# Patient Record
Sex: Male | Born: 1955 | Race: White | Hispanic: No | Marital: Married | Smoking: Never smoker
Health system: Southern US, Community
[De-identification: ages and names within clinical notes are randomized; demographics above are authoritative.]

## PROBLEM LIST (undated history)

## (undated) DIAGNOSIS — M2550 Pain in unspecified joint: Secondary | ICD-10-CM

## (undated) DIAGNOSIS — M1990 Unspecified osteoarthritis, unspecified site: Secondary | ICD-10-CM

## (undated) DIAGNOSIS — G709 Myoneural disorder, unspecified: Secondary | ICD-10-CM

## (undated) DIAGNOSIS — I10 Essential (primary) hypertension: Secondary | ICD-10-CM

## (undated) DIAGNOSIS — I2510 Atherosclerotic heart disease of native coronary artery without angina pectoris: Secondary | ICD-10-CM

## (undated) DIAGNOSIS — E119 Type 2 diabetes mellitus without complications: Secondary | ICD-10-CM

## (undated) DIAGNOSIS — K219 Gastro-esophageal reflux disease without esophagitis: Secondary | ICD-10-CM

## (undated) DIAGNOSIS — R3911 Hesitancy of micturition: Secondary | ICD-10-CM

## (undated) HISTORY — PX: CORONARY ANGIOPLASTY: SHX604

---

## 2013-07-15 HISTORY — PX: COLONOSCOPY: SHX174

## 2015-05-04 ENCOUNTER — Other Ambulatory Visit: Payer: Self-pay | Admitting: Neurosurgery

## 2015-07-01 ENCOUNTER — Encounter (HOSPITAL_COMMUNITY)
Admission: RE | Admit: 2015-07-01 | Discharge: 2015-07-01 | Disposition: A | Discharge status: Home / Self Care | Payer: Self-pay | Source: Ambulatory Visit | Attending: Neurosurgery | Admitting: Neurosurgery

## 2015-07-01 ENCOUNTER — Encounter (HOSPITAL_COMMUNITY): Payer: Self-pay

## 2015-07-01 DIAGNOSIS — Z79899 Other long term (current) drug therapy: Secondary | ICD-10-CM | POA: Insufficient documentation

## 2015-07-01 DIAGNOSIS — I10 Essential (primary) hypertension: Secondary | ICD-10-CM | POA: Insufficient documentation

## 2015-07-01 DIAGNOSIS — E119 Type 2 diabetes mellitus without complications: Secondary | ICD-10-CM | POA: Insufficient documentation

## 2015-07-01 DIAGNOSIS — M4806 Spinal stenosis, lumbar region: Secondary | ICD-10-CM | POA: Insufficient documentation

## 2015-07-01 DIAGNOSIS — Z955 Presence of coronary angioplasty implant and graft: Secondary | ICD-10-CM | POA: Insufficient documentation

## 2015-07-01 DIAGNOSIS — Z794 Long term (current) use of insulin: Secondary | ICD-10-CM | POA: Insufficient documentation

## 2015-07-01 DIAGNOSIS — Z7902 Long term (current) use of antithrombotics/antiplatelets: Secondary | ICD-10-CM | POA: Insufficient documentation

## 2015-07-01 DIAGNOSIS — Z7984 Long term (current) use of oral hypoglycemic drugs: Secondary | ICD-10-CM | POA: Insufficient documentation

## 2015-07-01 DIAGNOSIS — Z01812 Encounter for preprocedural laboratory examination: Secondary | ICD-10-CM | POA: Insufficient documentation

## 2015-07-01 DIAGNOSIS — Z0183 Encounter for blood typing: Secondary | ICD-10-CM | POA: Insufficient documentation

## 2015-07-01 DIAGNOSIS — K219 Gastro-esophageal reflux disease without esophagitis: Secondary | ICD-10-CM | POA: Insufficient documentation

## 2015-07-01 DIAGNOSIS — I2510 Atherosclerotic heart disease of native coronary artery without angina pectoris: Secondary | ICD-10-CM | POA: Insufficient documentation

## 2015-07-01 DIAGNOSIS — Z01818 Encounter for other preprocedural examination: Secondary | ICD-10-CM | POA: Insufficient documentation

## 2015-07-01 HISTORY — DX: Unspecified osteoarthritis, unspecified site: M19.90

## 2015-07-01 HISTORY — DX: Hesitancy of micturition: R39.11

## 2015-07-01 HISTORY — DX: Atherosclerotic heart disease of native coronary artery without angina pectoris: I25.10

## 2015-07-01 HISTORY — DX: Essential (primary) hypertension: I10

## 2015-07-01 HISTORY — DX: Gastro-esophageal reflux disease without esophagitis: K21.9

## 2015-07-01 HISTORY — DX: Myoneural disorder, unspecified: G70.9

## 2015-07-01 HISTORY — DX: Pain in unspecified joint: M25.50

## 2015-07-01 HISTORY — DX: Type 2 diabetes mellitus without complications: E11.9

## 2015-07-01 LAB — CBC
HCT: 46.8 % (ref 39.0–52.0)
HEMOGLOBIN: 16.1 g/dL (ref 13.0–17.0)
MCH: 30.8 pg (ref 26.0–34.0)
MCHC: 34.4 g/dL (ref 30.0–36.0)
MCV: 89.7 fL (ref 78.0–100.0)
PLATELETS: 255 10*3/uL (ref 150–400)
RBC: 5.22 MIL/uL (ref 4.22–5.81)
RDW: 12.8 % (ref 11.5–15.5)
WBC: 7 10*3/uL (ref 4.0–10.5)

## 2015-07-01 LAB — TYPE AND SCREEN
ABO/RH(D): A POS
Antibody Screen: NEGATIVE

## 2015-07-01 LAB — BASIC METABOLIC PANEL
ANION GAP: 9 (ref 5–15)
BUN: 13 mg/dL (ref 6–20)
CHLORIDE: 103 mmol/L (ref 101–111)
CO2: 24 mmol/L (ref 22–32)
Calcium: 9.6 mg/dL (ref 8.9–10.3)
Creatinine, Ser: 0.68 mg/dL (ref 0.61–1.24)
GFR calc non Af Amer: >60 mL/min (ref >60)
Glucose, Bld: 157 mg/dL — ABNORMAL HIGH (ref 65–99)
Potassium: 4.5 mmol/L (ref 3.5–5.1)
Sodium: 136 mmol/L (ref 135–145)

## 2015-07-01 LAB — ABO/RH: ABO/RH(D): A POS

## 2015-07-01 LAB — GLUCOSE, CAPILLARY: Glucose-Capillary: 208 mg/dL — ABNORMAL HIGH (ref 65–99)

## 2015-07-01 LAB — SURGICAL PCR SCREEN
MRSA, PCR: NEGATIVE
Staphylococcus aureus: NEGATIVE

## 2015-07-01 NOTE — Pre-Procedure Instructions (Signed)
Joneric Myren  07/01/2015      [facility] [address], [place] - [address] [place] [address] Phone: [phone] Fax: [phone]    Your procedure is scheduled on 07-13-2015   Wednesday   Report to [facility] Admitting at 6:30 AM .  Call this number if you have problems the morning of surgery:  [phone]   Remember:  Do not eat food or drink liquids after midnight.   Take these medicines the morning of surgery with A SIP OF WATER Amlodipine,metoprolol                                How to Manage Your Diabetes Before Surgery   Why is it important to control my blood sugar before and after surgery?   Improving blood sugar levels before and after surgery helps healing and can limit problems.  A way of improving blood sugar control is eating a healthy diet by:  - Eating less sugar and carbohydrates  - Increasing activity/exercise  - Talk with your doctor about reaching your blood sugar goals  High blood sugars (greater than 180 mg/dL) can raise your risk of infections and slow down your recovery so you will need to focus on controlling your diabetes during the weeks before surgery.  Make sure that the doctor who takes care of your diabetes knows about your planned surgery including the date and location.  How do I manage my blood sugars before surgery?   Check your blood sugar at least 4 times a day, 2 days before surgery to make sure that they are not too high or low.   Check your blood sugar the morning of your surgery when you wake up and every 2  hours until you get to the Short-Stay unit.  If your blood sugar is less than 70 mg/dL, you will need to treat for low blood sugar by:  Treat a low blood sugar (less than 70 mg/dL) with 1/2 cup of clear juice (cranberry or apple), 4 glucose tablets, OR glucose gel.  Recheck blood sugar in 15 minutes after treatment (to make sure it is greater than 70 mg/dL).   If blood sugar is not greater than 70 mg/dL on re-check, call [phone] for further instructions.   Report your blood sugar to the Short-Stay nurse when you get to Short-Stay.  References:  [facility], 2007 "How to Manage your Diabetes Before and After Surgery".  What do I do about my diabetes medications?   Do not take oral diabetes medicines (pills) the morning of surgery.    THE NIGHT BEFORE SURGERY, take regular dose before meal time, no insulin at bedtime    THE MORNING OF SURGERY, take 10-12  units of Novolin Insulin.    Do not take other diabetes injectables the day of surgery including Byetta, Victoza, Bydureon, and Trulicity.            Do not wear jewelry, make-up or nail polish.  Do not wear lotions, powders, or perfumes.    Do not shave 48 hours prior to surgery.  Men may shave face and neck.   Do not bring valuables to the hospital.  [facility] is not responsible for any belongings or valuables.  Contacts, dentures or bridgework may not be worn into surgery.  Leave your suitcase in the car.  After surgery it may  be brought to your room.  For patients admitted to the hospital, discharge time will be determined by your treatment team.   .    Special instructions:  SEE ATTACHED SHEET "PREPARING FOR SURGERY" FOR INSTRUCTIONS ON CHG SHOWER  Please read over the following fact sheets that you were given. Pain Booklet, Blood Transfusion Information and Surgical Site Infection Prevention

## 2015-07-01 NOTE — Progress Notes (Signed)
Requested from Dr. Henrietta Hooveravenkar EKG, Stress test ,OV, Cardiac clearance.  Requested records from [facility] on cardiac stent placement and angioplasty

## 2015-07-02 LAB — HEMOGLOBIN A1C
Hgb A1c MFr Bld: 8.1 % — ABNORMAL HIGH (ref 4.8–5.6)
Mean Plasma Glucose: 186 mg/dL

## 2015-07-04 NOTE — Progress Notes (Signed)
Anesthesia Chart Review:  Pt is 59 year old male scheduled for L4-5, L5-S1 PLIF, L4 laminectomy, L5 Gill procedure on 07/13/2015 with Dr. Newell CoralNudelman.   Pt see providers at [facility] in [place], last office visit 03/31/15.   PMH includes: CAD (s/p 4 DESs 2012), HTN, DM, GERD. Never smoker. BMI 30.   Medications include: amlodipine, ASA, lipitor, benazepril, plavix, dapagliflozin, gemfibrozil, glipizide, novolog, humulin N, metformin, metoprolol.   Preoperative labs reviewed.  Glucose 157, hgbA1c 8.1.   EKG 03/31/15: sinus bradycardia (58 bpm).   Nuclear stress test 04/12/15:  - Cardiac images do not reveal any evidence of ischemia.  - EF >65%  Cardiac cath 09/08/12 ([facility]):  - Severe stenosis of OM3.  - Normal LV function - One vessel CAD - Successful PCI/PTCA of proximal 3rd obtuse marginal coronary artery -- By notes, 4 DES placed in 2012 found to be patent  If no changes, I anticipate pt can proceed with surgery as scheduled.   Rica Mastngela Kabbe, FNP-BC [facility]/Anesthesiology Phone: [phone] 07/04/2015 3:49 PM

## 2015-07-12 MED ORDER — CEFAZOLIN SODIUM-DEXTROSE 2-3 GM-% IV SOLR
2.0000 g | INTRAVENOUS | Status: AC
  Administered 2015-07-13 (×2): 2 g via INTRAVENOUS
  Filled 2015-07-12: qty 50

## 2015-07-13 ENCOUNTER — Inpatient Hospital Stay (HOSPITAL_COMMUNITY): Payer: Self-pay

## 2015-07-13 ENCOUNTER — Encounter (HOSPITAL_COMMUNITY)
Admission: RE | Disposition: A | Discharge status: Home / Self Care | Payer: PPO | Source: Ambulatory Visit | Attending: Neurosurgery

## 2015-07-13 ENCOUNTER — Inpatient Hospital Stay (HOSPITAL_COMMUNITY): Payer: Self-pay | Admitting: Emergency Medicine

## 2015-07-13 ENCOUNTER — Inpatient Hospital Stay (HOSPITAL_COMMUNITY)
Admission: RE | Admit: 2015-07-13 | Discharge: 2015-07-15 | DRG: 460 | Disposition: A | Discharge status: Home / Self Care | Payer: Self-pay | Source: Ambulatory Visit | Attending: Neurosurgery | Admitting: Neurosurgery

## 2015-07-13 ENCOUNTER — Inpatient Hospital Stay (HOSPITAL_COMMUNITY): Payer: Self-pay | Admitting: Anesthesiology

## 2015-07-13 ENCOUNTER — Encounter (HOSPITAL_COMMUNITY): Payer: Self-pay | Admitting: *Deleted

## 2015-07-13 DIAGNOSIS — M4726 Other spondylosis with radiculopathy, lumbar region: Secondary | ICD-10-CM | POA: Diagnosis present

## 2015-07-13 DIAGNOSIS — I10 Essential (primary) hypertension: Secondary | ICD-10-CM | POA: Diagnosis present

## 2015-07-13 DIAGNOSIS — Z7984 Long term (current) use of oral hypoglycemic drugs: Secondary | ICD-10-CM

## 2015-07-13 DIAGNOSIS — E119 Type 2 diabetes mellitus without complications: Secondary | ICD-10-CM | POA: Diagnosis present

## 2015-07-13 DIAGNOSIS — Z419 Encounter for procedure for purposes other than remedying health state, unspecified: Secondary | ICD-10-CM

## 2015-07-13 DIAGNOSIS — M1990 Unspecified osteoarthritis, unspecified site: Secondary | ICD-10-CM | POA: Diagnosis present

## 2015-07-13 DIAGNOSIS — Z79899 Other long term (current) drug therapy: Secondary | ICD-10-CM

## 2015-07-13 DIAGNOSIS — M4806 Spinal stenosis, lumbar region: Principal | ICD-10-CM | POA: Diagnosis present

## 2015-07-13 DIAGNOSIS — M5137 Other intervertebral disc degeneration, lumbosacral region: Secondary | ICD-10-CM | POA: Diagnosis present

## 2015-07-13 DIAGNOSIS — K219 Gastro-esophageal reflux disease without esophagitis: Secondary | ICD-10-CM | POA: Diagnosis present

## 2015-07-13 DIAGNOSIS — Z885 Allergy status to narcotic agent status: Secondary | ICD-10-CM

## 2015-07-13 DIAGNOSIS — Z7982 Long term (current) use of aspirin: Secondary | ICD-10-CM

## 2015-07-13 DIAGNOSIS — M4316 Spondylolisthesis, lumbar region: Secondary | ICD-10-CM | POA: Diagnosis present

## 2015-07-13 DIAGNOSIS — M48061 Spinal stenosis, lumbar region without neurogenic claudication: Secondary | ICD-10-CM | POA: Diagnosis present

## 2015-07-13 DIAGNOSIS — R3911 Hesitancy of micturition: Secondary | ICD-10-CM | POA: Diagnosis present

## 2015-07-13 DIAGNOSIS — Z955 Presence of coronary angioplasty implant and graft: Secondary | ICD-10-CM

## 2015-07-13 DIAGNOSIS — S32059A Unspecified fracture of fifth lumbar vertebra, initial encounter for closed fracture: Secondary | ICD-10-CM | POA: Diagnosis present

## 2015-07-13 DIAGNOSIS — I2510 Atherosclerotic heart disease of native coronary artery without angina pectoris: Secondary | ICD-10-CM | POA: Diagnosis present

## 2015-07-13 DIAGNOSIS — Z23 Encounter for immunization: Secondary | ICD-10-CM

## 2015-07-13 DIAGNOSIS — Z794 Long term (current) use of insulin: Secondary | ICD-10-CM

## 2015-07-13 DIAGNOSIS — Z7902 Long term (current) use of antithrombotics/antiplatelets: Secondary | ICD-10-CM

## 2015-07-13 DIAGNOSIS — M5126 Other intervertebral disc displacement, lumbar region: Secondary | ICD-10-CM | POA: Diagnosis present

## 2015-07-13 LAB — GLUCOSE, CAPILLARY
Glucose-Capillary: 143 mg/dL — ABNORMAL HIGH (ref 65–99)
Glucose-Capillary: 139 mg/dL — ABNORMAL HIGH (ref 65–99)
Glucose-Capillary: 176 mg/dL — ABNORMAL HIGH (ref 65–99)
Glucose-Capillary: 136 mg/dL — ABNORMAL HIGH (ref 65–99)

## 2015-07-13 SURGERY — POSTERIOR LUMBAR FUSION 2 LEVEL
Anesthesia: General | Site: Back

## 2015-07-13 MED ORDER — ATORVASTATIN CALCIUM 40 MG PO TABS
40.0000 mg | ORAL_TABLET | Freq: Every day | ORAL | Status: DC
  Administered 2015-07-14 – 2015-07-15 (×2): 40 mg via ORAL
  Filled 2015-07-13: qty 1
  Filled 2015-07-13: qty 2
  Filled 2015-07-13: qty 1
  Filled 2015-07-13: qty 2

## 2015-07-13 MED ORDER — AMLODIPINE BESYLATE 10 MG PO TABS
10.0000 mg | ORAL_TABLET | Freq: Every day | ORAL | Status: DC
  Administered 2015-07-14 – 2015-07-15 (×2): 10 mg via ORAL
  Filled 2015-07-13 (×2): qty 1

## 2015-07-13 MED ORDER — SODIUM CHLORIDE 0.9 % IJ SOLN
INTRAMUSCULAR | Status: AC
  Filled 2015-07-13: qty 10

## 2015-07-13 MED ORDER — HYDROCODONE-ACETAMINOPHEN 5-325 MG PO TABS: 1.0000 | ORAL_TABLET | ORAL | Status: DC | PRN

## 2015-07-13 MED ORDER — ROCURONIUM BROMIDE 100 MG/10ML IV SOLN
INTRAVENOUS | Status: DC | PRN
  Administered 2015-07-13: 50 mg via INTRAVENOUS
  Administered 2015-07-13: 20 mg via INTRAVENOUS
  Administered 2015-07-13 (×2): 10 mg via INTRAVENOUS
  Administered 2015-07-13: 20 mg via INTRAVENOUS

## 2015-07-13 MED ORDER — SUGAMMADEX SODIUM 200 MG/2ML IV SOLN
INTRAVENOUS | Status: DC | PRN
  Administered 2015-07-13: 200 mg via INTRAVENOUS

## 2015-07-13 MED ORDER — MORPHINE SULFATE (PF) 4 MG/ML IV SOLN: 4.0000 mg | INTRAVENOUS | Status: DC | PRN

## 2015-07-13 MED ORDER — ONDANSETRON HCL 4 MG/2ML IJ SOLN: 4.0000 mg | Freq: Four times a day (QID) | INTRAMUSCULAR | Status: DC | PRN

## 2015-07-13 MED ORDER — GLIPIZIDE 10 MG PO TABS
10.0000 mg | ORAL_TABLET | Freq: Two times a day (BID) | ORAL | Status: DC
  Administered 2015-07-13 – 2015-07-15 (×4): 10 mg via ORAL
  Filled 2015-07-13 (×5): qty 1

## 2015-07-13 MED ORDER — METFORMIN HCL 500 MG PO TABS: 500.0000 mg | ORAL_TABLET | Freq: Two times a day (BID) | ORAL | Status: DC | PRN

## 2015-07-13 MED ORDER — PROPOFOL 10 MG/ML IV BOLUS
INTRAVENOUS | Status: AC
  Filled 2015-07-13: qty 20

## 2015-07-13 MED ORDER — BENAZEPRIL HCL 40 MG PO TABS
40.0000 mg | ORAL_TABLET | Freq: Every day | ORAL | Status: DC
  Administered 2015-07-13 – 2015-07-15 (×3): 40 mg via ORAL
  Filled 2015-07-13 (×3): qty 1

## 2015-07-13 MED ORDER — ACETAMINOPHEN 650 MG RE SUPP: 650.0000 mg | RECTAL | Status: DC | PRN

## 2015-07-13 MED ORDER — HYDROMORPHONE HCL 1 MG/ML IJ SOLN: 1.0000 mg | INTRAMUSCULAR | Status: DC | PRN

## 2015-07-13 MED ORDER — PROMETHAZINE HCL 25 MG/ML IJ SOLN
6.2500 mg | INTRAMUSCULAR | Status: DC | PRN
  Administered 2015-07-13: 6.25 mg via INTRAVENOUS

## 2015-07-13 MED ORDER — ALUM & MAG HYDROXIDE-SIMETH 200-200-20 MG/5ML PO SUSP: 30.0000 mL | Freq: Four times a day (QID) | ORAL | Status: DC | PRN

## 2015-07-13 MED ORDER — FENTANYL CITRATE (PF) 250 MCG/5ML IJ SOLN
INTRAMUSCULAR | Status: AC
  Filled 2015-07-13: qty 5

## 2015-07-13 MED ORDER — SODIUM CHLORIDE 0.9 % IV SOLN: INTRAVENOUS | Status: DC

## 2015-07-13 MED ORDER — ROCURONIUM BROMIDE 50 MG/5ML IV SOLN
INTRAVENOUS | Status: AC
  Filled 2015-07-13: qty 1

## 2015-07-13 MED ORDER — ONDANSETRON HCL 4 MG/2ML IJ SOLN
INTRAMUSCULAR | Status: DC | PRN
  Administered 2015-07-13: 4 mg via INTRAVENOUS

## 2015-07-13 MED ORDER — KETOROLAC TROMETHAMINE 30 MG/ML IJ SOLN
30.0000 mg | Freq: Four times a day (QID) | INTRAMUSCULAR | Status: DC
  Administered 2015-07-13 – 2015-07-15 (×7): 30 mg via INTRAVENOUS
  Filled 2015-07-13 (×7): qty 1

## 2015-07-13 MED ORDER — BISACODYL 10 MG RE SUPP: 10.0000 mg | Freq: Every day | RECTAL | Status: DC | PRN

## 2015-07-13 MED ORDER — HYDROXYZINE HCL 50 MG/ML IM SOLN
50.0000 mg | INTRAMUSCULAR | Status: DC | PRN
  Filled 2015-07-13: qty 1

## 2015-07-13 MED ORDER — FENTANYL CITRATE (PF) 100 MCG/2ML IJ SOLN
INTRAMUSCULAR | Status: DC | PRN
  Administered 2015-07-13: 50 ug via INTRAVENOUS
  Administered 2015-07-13: 150 ug via INTRAVENOUS
  Administered 2015-07-13 (×4): 50 ug via INTRAVENOUS

## 2015-07-13 MED ORDER — MAGNESIUM HYDROXIDE 400 MG/5ML PO SUSP: 30.0000 mL | Freq: Every day | ORAL | Status: DC | PRN

## 2015-07-13 MED ORDER — THROMBIN 20000 UNITS EX SOLR
CUTANEOUS | Status: DC | PRN
  Administered 2015-07-13: 10:00:00 via TOPICAL

## 2015-07-13 MED ORDER — CYCLOBENZAPRINE HCL 10 MG PO TABS
ORAL_TABLET | ORAL | Status: AC
  Filled 2015-07-13: qty 1

## 2015-07-13 MED ORDER — SODIUM CHLORIDE 0.9 % IJ SOLN
3.0000 mL | Freq: Two times a day (BID) | INTRAMUSCULAR | Status: DC
  Administered 2015-07-13 – 2015-07-14 (×2): 3 mL via INTRAVENOUS

## 2015-07-13 MED ORDER — METOCLOPRAMIDE HCL 5 MG/ML IJ SOLN
INTRAMUSCULAR | Status: DC | PRN
  Administered 2015-07-13: 10 mg via INTRAVENOUS

## 2015-07-13 MED ORDER — LIDOCAINE HCL (CARDIAC) 20 MG/ML IV SOLN
INTRAVENOUS | Status: DC | PRN
  Administered 2015-07-13: 60 mg via INTRAVENOUS

## 2015-07-13 MED ORDER — KETOROLAC TROMETHAMINE 30 MG/ML IJ SOLN
INTRAMUSCULAR | Status: AC
  Filled 2015-07-13: qty 1

## 2015-07-13 MED ORDER — METOCLOPRAMIDE HCL 5 MG/ML IJ SOLN
INTRAMUSCULAR | Status: AC
  Filled 2015-07-13: qty 2

## 2015-07-13 MED ORDER — MENTHOL 3 MG MT LOZG: 1.0000 | LOZENGE | OROMUCOSAL | Status: DC | PRN

## 2015-07-13 MED ORDER — MIDAZOLAM HCL 5 MG/5ML IJ SOLN
INTRAMUSCULAR | Status: DC | PRN
  Administered 2015-07-13: 2 mg via INTRAVENOUS

## 2015-07-13 MED ORDER — KETOROLAC TROMETHAMINE 30 MG/ML IJ SOLN
30.0000 mg | Freq: Once | INTRAMUSCULAR | Status: AC
  Administered 2015-07-13: 30 mg via INTRAVENOUS

## 2015-07-13 MED ORDER — OXYCODONE-ACETAMINOPHEN 5-325 MG PO TABS
ORAL_TABLET | ORAL | Status: AC
  Filled 2015-07-13: qty 2

## 2015-07-13 MED ORDER — INSULIN NPH (HUMAN) (ISOPHANE) 100 UNIT/ML ~~LOC~~ SUSP
20.0000 [IU] | Freq: Every day | SUBCUTANEOUS | Status: DC
  Administered 2015-07-14: 25 [IU] via SUBCUTANEOUS
  Administered 2015-07-15: 15 [IU] via SUBCUTANEOUS
  Filled 2015-07-13: qty 10

## 2015-07-13 MED ORDER — SUGAMMADEX SODIUM 200 MG/2ML IV SOLN
INTRAVENOUS | Status: AC
  Filled 2015-07-13: qty 2

## 2015-07-13 MED ORDER — ARTIFICIAL TEARS OP OINT
TOPICAL_OINTMENT | OPHTHALMIC | Status: DC | PRN
  Administered 2015-07-13: 1 via OPHTHALMIC

## 2015-07-13 MED ORDER — FENTANYL CITRATE (PF) 100 MCG/2ML IJ SOLN: 25.0000 ug | INTRAMUSCULAR | Status: DC | PRN

## 2015-07-13 MED ORDER — LIDOCAINE-EPINEPHRINE 1 %-1:100000 IJ SOLN
INTRAMUSCULAR | Status: DC | PRN
  Administered 2015-07-13: 30 mL

## 2015-07-13 MED ORDER — CYCLOBENZAPRINE HCL 10 MG PO TABS
10.0000 mg | ORAL_TABLET | Freq: Three times a day (TID) | ORAL | Status: DC | PRN
  Administered 2015-07-13 – 2015-07-15 (×5): 10 mg via ORAL
  Filled 2015-07-13 (×4): qty 1

## 2015-07-13 MED ORDER — OXYCODONE-ACETAMINOPHEN 5-325 MG PO TABS
1.0000 | ORAL_TABLET | ORAL | Status: DC | PRN
  Administered 2015-07-13 – 2015-07-15 (×11): 2 via ORAL
  Filled 2015-07-13 (×10): qty 2

## 2015-07-13 MED ORDER — ONDANSETRON HCL 4 MG PO TABS: 4.0000 mg | ORAL_TABLET | Freq: Four times a day (QID) | ORAL | Status: DC | PRN

## 2015-07-13 MED ORDER — ONDANSETRON HCL 4 MG/2ML IJ SOLN
INTRAMUSCULAR | Status: AC
  Filled 2015-07-13: qty 2

## 2015-07-13 MED ORDER — MIDAZOLAM HCL 2 MG/2ML IJ SOLN
INTRAMUSCULAR | Status: AC
  Filled 2015-07-13: qty 4

## 2015-07-13 MED ORDER — PHENYLEPHRINE 40 MCG/ML (10ML) SYRINGE FOR IV PUSH (FOR BLOOD PRESSURE SUPPORT)
PREFILLED_SYRINGE | INTRAVENOUS | Status: AC
  Filled 2015-07-13: qty 10

## 2015-07-13 MED ORDER — SODIUM CHLORIDE 0.9 % IJ SOLN: 3.0000 mL | INTRAMUSCULAR | Status: DC | PRN

## 2015-07-13 MED ORDER — LACTATED RINGERS IV SOLN
INTRAVENOUS | Status: DC | PRN
  Administered 2015-07-13: 08:00:00 via INTRAVENOUS

## 2015-07-13 MED ORDER — GEMFIBROZIL 600 MG PO TABS
600.0000 mg | ORAL_TABLET | Freq: Two times a day (BID) | ORAL | Status: DC
  Administered 2015-07-13 – 2015-07-15 (×4): 600 mg via ORAL
  Filled 2015-07-13 (×7): qty 1

## 2015-07-13 MED ORDER — BUPIVACAINE HCL (PF) 0.5 % IJ SOLN
INTRAMUSCULAR | Status: DC | PRN
  Administered 2015-07-13: 30 mL

## 2015-07-13 MED ORDER — CEFAZOLIN SODIUM-DEXTROSE 2-3 GM-% IV SOLR
INTRAVENOUS | Status: AC
  Filled 2015-07-13: qty 50

## 2015-07-13 MED ORDER — EPHEDRINE SULFATE 50 MG/ML IJ SOLN
INTRAMUSCULAR | Status: AC
  Filled 2015-07-13: qty 1

## 2015-07-13 MED ORDER — SODIUM CHLORIDE 0.9 % IR SOLN
Status: DC | PRN
  Administered 2015-07-13: 10:00:00

## 2015-07-13 MED ORDER — TAMSULOSIN HCL 0.4 MG PO CAPS
0.4000 mg | ORAL_CAPSULE | Freq: Every day | ORAL | Status: DC
  Administered 2015-07-14 – 2015-07-15 (×2): 0.4 mg via ORAL
  Filled 2015-07-13 (×2): qty 1

## 2015-07-13 MED ORDER — HYDROMORPHONE HCL 2 MG PO TABS: 1.0000 mg | ORAL_TABLET | ORAL | Status: DC | PRN

## 2015-07-13 MED ORDER — 0.9 % SODIUM CHLORIDE (POUR BTL) OPTIME
TOPICAL | Status: DC | PRN
  Administered 2015-07-13 (×2): 1000 mL

## 2015-07-13 MED ORDER — SODIUM CHLORIDE 0.9 % IV SOLN
10.0000 mg | INTRAVENOUS | Status: DC | PRN
  Administered 2015-07-13: 40 ug/min via INTRAVENOUS
  Administered 2015-07-13: 10 ug/min via INTRAVENOUS

## 2015-07-13 MED ORDER — ACETAMINOPHEN 325 MG PO TABS: 650.0000 mg | ORAL_TABLET | ORAL | Status: DC | PRN

## 2015-07-13 MED ORDER — SUCCINYLCHOLINE CHLORIDE 20 MG/ML IJ SOLN
INTRAMUSCULAR | Status: AC
  Filled 2015-07-13: qty 1

## 2015-07-13 MED ORDER — METOPROLOL TARTRATE 25 MG PO TABS
25.0000 mg | ORAL_TABLET | Freq: Two times a day (BID) | ORAL | Status: DC
  Administered 2015-07-13 – 2015-07-15 (×4): 25 mg via ORAL
  Filled 2015-07-13 (×5): qty 1

## 2015-07-13 MED ORDER — DAPAGLIFLOZIN PROPANEDIOL 10 MG PO TABS: 10.0000 mg | ORAL_TABLET | Freq: Every day | ORAL | Status: DC

## 2015-07-13 MED ORDER — LACTATED RINGERS IV SOLN
INTRAVENOUS | Status: DC | PRN
  Administered 2015-07-13 (×3): via INTRAVENOUS

## 2015-07-13 MED ORDER — HYDROXYZINE HCL 25 MG PO TABS: 50.0000 mg | ORAL_TABLET | ORAL | Status: DC | PRN

## 2015-07-13 MED ORDER — PHENYLEPHRINE HCL 10 MG/ML IJ SOLN
INTRAMUSCULAR | Status: DC | PRN
  Administered 2015-07-13 (×5): 80 ug via INTRAVENOUS

## 2015-07-13 MED ORDER — ACETAMINOPHEN 10 MG/ML IV SOLN
INTRAVENOUS | Status: AC
  Administered 2015-07-13: 1000 mg via INTRAVENOUS
  Filled 2015-07-13: qty 100

## 2015-07-13 MED ORDER — PHENOL 1.4 % MT LIQD: 1.0000 | OROMUCOSAL | Status: DC | PRN

## 2015-07-13 MED ORDER — LIDOCAINE HCL (CARDIAC) 20 MG/ML IV SOLN
INTRAVENOUS | Status: AC
  Filled 2015-07-13: qty 5

## 2015-07-13 MED ORDER — PROPOFOL 10 MG/ML IV BOLUS
INTRAVENOUS | Status: DC | PRN
  Administered 2015-07-13: 170 mg via INTRAVENOUS

## 2015-07-13 MED ORDER — EPHEDRINE SULFATE 50 MG/ML IJ SOLN
INTRAMUSCULAR | Status: DC | PRN
  Administered 2015-07-13: 10 mg via INTRAVENOUS
  Administered 2015-07-13: 5 mg via INTRAVENOUS
  Administered 2015-07-13: 10 mg via INTRAVENOUS
  Administered 2015-07-13 (×2): 5 mg via INTRAVENOUS

## 2015-07-13 MED ORDER — INSULIN ASPART 100 UNIT/ML ~~LOC~~ SOLN
20.0000 [IU] | Freq: Three times a day (TID) | SUBCUTANEOUS | Status: DC
  Administered 2015-07-13 – 2015-07-14 (×2): 30 [IU] via SUBCUTANEOUS
  Administered 2015-07-14 (×2): 35 [IU] via SUBCUTANEOUS
  Administered 2015-07-15: 40 [IU] via SUBCUTANEOUS

## 2015-07-13 MED ORDER — PROMETHAZINE HCL 25 MG/ML IJ SOLN
INTRAMUSCULAR | Status: AC
  Filled 2015-07-13: qty 1

## 2015-07-13 SURGICAL SUPPLY — 77 items
BAG DECANTER FOR FLEXI CONT (MISCELLANEOUS) ×2 IMPLANT
BENZOIN TINCTURE PRP APPL 2/3 (GAUZE/BANDAGES/DRESSINGS) ×2 IMPLANT
BLADE CLIPPER SURG (BLADE) IMPLANT
BRUSH SCRUB EZ PLAIN DRY (MISCELLANEOUS) ×2 IMPLANT
BUR ACRON 5.0MM COATED (BURR) ×2 IMPLANT
BUR MATCHSTICK NEURO 3.0 LAGG (BURR) ×2 IMPLANT
CANISTER SUCT 3000ML PPV (MISCELLANEOUS) ×2 IMPLANT
CAP LCK SPNE (Orthopedic Implant) ×6 IMPLANT
CAP LOCK SPINE RADIUS (Orthopedic Implant) ×6 IMPLANT
CAP LOCKING (Orthopedic Implant) ×6 IMPLANT
CONT SPEC 4OZ CLIKSEAL STRL BL (MISCELLANEOUS) ×2 IMPLANT
COVER BACK TABLE 60X90IN (DRAPES) ×2 IMPLANT
DERMABOND ADVANCED (GAUZE/BANDAGES/DRESSINGS) ×1
DERMABOND ADVANCED .7 DNX12 (GAUZE/BANDAGES/DRESSINGS) ×1 IMPLANT
DRAPE C-ARM 42X72 X-RAY (DRAPES) ×4 IMPLANT
DRAPE LAPAROTOMY 100X72X124 (DRAPES) ×2 IMPLANT
DRAPE POUCH INSTRU U-SHP 10X18 (DRAPES) ×2 IMPLANT
DRAPE PROXIMA HALF (DRAPES) ×2 IMPLANT
DRSG EMULSION OIL 3X3 NADH (GAUZE/BANDAGES/DRESSINGS) IMPLANT
ELECT REM PT RETURN 9FT ADLT (ELECTROSURGICAL) ×2
ELECTRODE REM PT RTRN 9FT ADLT (ELECTROSURGICAL) ×1 IMPLANT
GAUZE SPONGE 4X4 12PLY STRL (GAUZE/BANDAGES/DRESSINGS) ×2 IMPLANT
GAUZE SPONGE 4X4 16PLY XRAY LF (GAUZE/BANDAGES/DRESSINGS) IMPLANT
GLOVE BIOGEL PI IND STRL 8 (GLOVE) ×2 IMPLANT
GLOVE BIOGEL PI INDICATOR 8 (GLOVE) ×2
GLOVE ECLIPSE 7.5 STRL STRAW (GLOVE) ×4 IMPLANT
GLOVE EXAM NITRILE LRG STRL (GLOVE) IMPLANT
GLOVE EXAM NITRILE MD LF STRL (GLOVE) IMPLANT
GLOVE EXAM NITRILE XL STR (GLOVE) IMPLANT
GLOVE EXAM NITRILE XS STR PU (GLOVE) IMPLANT
GOWN STRL REUS W/ TWL LRG LVL3 (GOWN DISPOSABLE) IMPLANT
GOWN STRL REUS W/ TWL XL LVL3 (GOWN DISPOSABLE) ×2 IMPLANT
GOWN STRL REUS W/TWL 2XL LVL3 (GOWN DISPOSABLE) IMPLANT
GOWN STRL REUS W/TWL LRG LVL3 (GOWN DISPOSABLE)
GOWN STRL REUS W/TWL XL LVL3 (GOWN DISPOSABLE) ×2
KIT BASIN OR (CUSTOM PROCEDURE TRAY) ×2 IMPLANT
KIT INFUSE MEDIUM (Orthopedic Implant) ×2 IMPLANT
KIT ROOM TURNOVER OR (KITS) ×2 IMPLANT
MILL MEDIUM DISP (BLADE) ×2 IMPLANT
NEEDLE 18GX1X1/2 (RX/OR ONLY) (NEEDLE) ×2 IMPLANT
NEEDLE ASP BONE MRW 8GX15 (NEEDLE) ×2 IMPLANT
NEEDLE BONE MARROW 8GAX6 (NEEDLE) IMPLANT
NEEDLE SPNL 18GX3.5 QUINCKE PK (NEEDLE) ×2 IMPLANT
NEEDLE SPNL 22GX3.5 QUINCKE BK (NEEDLE) ×2 IMPLANT
NS IRRIG 1000ML POUR BTL (IV SOLUTION) ×4 IMPLANT
PACK LAMINECTOMY NEURO (CUSTOM PROCEDURE TRAY) ×2 IMPLANT
PAD ARMBOARD 7.5X6 YLW CONV (MISCELLANEOUS) ×6 IMPLANT
PATTIES SURGICAL .5 X.5 (GAUZE/BANDAGES/DRESSINGS) IMPLANT
PATTIES SURGICAL .5 X1 (DISPOSABLE) ×2 IMPLANT
PATTIES SURGICAL 1X1 (DISPOSABLE) IMPLANT
PEEK PLIF AVS 8X25X4 DEGREE (Peek) ×4 IMPLANT
PEEK PLIF AVS 9X25X4 (Peek) ×4 IMPLANT
ROD 50MM (Rod) ×2 IMPLANT
ROD SPNL 50X5.5XNS TI RDS (Rod) ×2 IMPLANT
SCREW 5.75X30MM (Screw) ×4 IMPLANT
SCREW 5.75X45MM (Screw) ×8 IMPLANT
SPONGE LAP 4X18 X RAY DECT (DISPOSABLE) ×2 IMPLANT
SPONGE NEURO XRAY DETECT 1X3 (DISPOSABLE) ×2 IMPLANT
SPONGE SURGIFOAM ABS GEL 100 (HEMOSTASIS) ×2 IMPLANT
STAPLER SKIN PROX WIDE 3.9 (STAPLE) IMPLANT
STRIP BIOACTIVE VITOSS 25X100X (Neuro Prosthesis/Implant) ×2 IMPLANT
STRIP BIOACTIVE VITOSS 25X52X4 (Orthopedic Implant) ×2 IMPLANT
STRIP CLOSURE SKIN 1/2X4 (GAUZE/BANDAGES/DRESSINGS) ×2 IMPLANT
SUT PROLENE 6 0 BV (SUTURE) IMPLANT
SUT VIC AB 1 CT1 18XBRD ANBCTR (SUTURE) ×3 IMPLANT
SUT VIC AB 1 CT1 8-18 (SUTURE) ×3
SUT VIC AB 2-0 CP2 18 (SUTURE) ×4 IMPLANT
SYR 3ML LL SCALE MARK (SYRINGE) IMPLANT
SYR 5ML LL (SYRINGE) IMPLANT
SYR CONTROL 10ML LL (SYRINGE) ×2 IMPLANT
TAPE CLOTH SURG 4X10 WHT LF (GAUZE/BANDAGES/DRESSINGS) ×2 IMPLANT
TOWEL OR 17X24 6PK STRL BLUE (TOWEL DISPOSABLE) ×2 IMPLANT
TOWEL OR 17X26 10 PK STRL BLUE (TOWEL DISPOSABLE) ×2 IMPLANT
TRAP SPECIMEN MUCOUS 40CC (MISCELLANEOUS) ×2 IMPLANT
TRAY FOLEY CATH 16FRSI W/METER (SET/KITS/TRAYS/PACK) ×2 IMPLANT
TRAY FOLEY W/METER SILVER 14FR (SET/KITS/TRAYS/PACK) IMPLANT
WATER STERILE IRR 1000ML POUR (IV SOLUTION) ×2 IMPLANT

## 2015-07-13 NOTE — Progress Notes (Signed)
Filed Vitals:   07/13/15 1445 07/13/15 1515 07/13/15 1530 07/13/15 1540  BP: 114/65 110/63 105/64 105/64  Pulse: 66 66 65 65  Temp:    97.5 F (36.4 C)  TempSrc:      Resp: 20 20    SpO2: 97% 95% 95% 94%    Patient resting in bed, but has ambulated with a staff through the length of the hallway. He did use a rolling walker. His dressing is clean and dry. He has a history of urinary hesitancy and is on Flomax, therefore we'll leave the catheter in overnight, and plan on removing in the morning. Patient is primarily having incisional pain, he's been treated with Toradol and Percocet. He feels that the left lumbar radicular pain is probably better.  Plan: Continue to progress through postoperative recovery. Monitor voiding function once Foley DC'd tomorrow morning. Encouraged to ambulate in the halls.  Hewitt ShortsNUDELMAN,ROBERT W, MD 07/13/2015, 8:16 PM

## 2015-07-13 NOTE — Op Note (Signed)
07/13/2015  2:10 PM  PATIENT:  Jose Dalton  59 y.o. male  PRE-OPERATIVE DIAGNOSIS:  lumbar stenosis with neurogenic claudication, lumbar spondylolisthesis, lumbar disc herniation, lumbar spondylosis, lumbar degenerative disease, left lumbar radiculopathy  POST-OPERATIVE DIAGNOSIS: lumbar stenosis with neurogenic claudication, lumbar spondylolisthesis, lumbar disc herniation, lumbar spondylosis, lumbar degenerative disease, left lumbar radiculopathy  PROCEDURE:  Procedure(s):  Inferior L4 laminectomy, L5 Gill procedure, bilateral L4-5 and L5-S1 posterior lumbar interbody arthrodesis with AVS peek interbody implants, Vitoss BA with bone marrow aspirate, and infuse, bilateral L4-S1 posterior lateral arthrodesis with radius segmental posterior instrumentation, locally harvested morcellized autograft, Vitoss BA with bone marrow aspirate, and infuse   SURGEON:  Surgeon(s): Shirlean Kelly, MD Tia Alert, MD  ASSISTANTS: Marikay Alar, M.D.  ANESTHESIA:   general  EBL:  Total I/O In: 3190 [I.V.:3000; Blood:190] Out: 1170 [Urine:670; Blood:500]  BLOOD ADMINISTERED:190 CC CELLSAVER  COUNT: Correct per nursing staff  DICTATION: Patient was brought to the operating room placed under general endotracheal anesthesia. The patient was turned to prone position, the lumbar region was prepped with Betadine soap and solution and draped in a sterile fashion. The midline was infiltrated with local anesthesia with epinephrine. A localizing x-ray was taken and then a midline incision was made and carried down through the subcutaneous tissue, bipolar cautery and electrocautery were used to maintain hemostasis. Dissection was carried down to the lumbar fascia. The fascia was incised bilaterally and the paraspinal muscles were dissected with a spinous process and lamina in a subperiosteal fashion. Another x-ray was taken for localization and the L4, L5, and S1 levels were localized. Dissection was then carried  out laterally over the facet complexes and the transverse processes of L4 and L5 and the ala of S1 were exposed and decorticated. 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 The posterior elements of L5 were progressively mobilized, freed up from the ligament of flavum and pseudoarthrosis, and removed en bloc, and then cleaned of soft tissue, and morselized in a bone mill for subsequent use as autograft. We then performed an inferior L4 laminectomy using the high-speed drill and Kerrison punches. The thecal sac and exiting nerve roots were identified including the L4 nerve roots the L5 nerve roots and the S1 nerve roots bilaterally. There is significant bony pseudoarthrotic overgrowth, overlying the L5 nerve roots as they exited, particularly on the left side. This was carefully removed, decompressing the exiting L5 nerve roots bilaterally.  Once the decompression of the stenotic compression of the thecal sac and exiting nerve roots was completed we proceeded with the posterior lumbar interbody arthrodesis. The annulus at each level was incised bilaterally and the disc space entered. A thorough discectomy was performed using pituitary rongeurs and curettes. Particular attention was paid to the disc protrusion laterally within the neural foramina at the L5-S1 level, causing ventral nerve root compression. This was carefully removed to further decompress the exiting L5 nerve roots bilaterally.  Once the discectomy was completed we began to prepare the endplate surfaces, removing the cartilaginous endplates surface. We then measured the height of the intervertebral disc space. We selected 9 x 25 x 4 AVS peek interbody implants for the L4-5 level, and 8 x 25 x 4 AVS peek interbody implants for the L5-S1 level.  The C-arm fluoroscope was then draped and brought in the field and we identified the pedicle entry points bilaterally at  the L4, L5, and S1 levels. Each of the 6 pedicles was  probed, we aspirated bone marrow aspirate from the vertebral bodies, this was injected over a 10 cc and a 5 cc strip of Vitoss BA. Then each of the pedicles was examined with the ball probe, good bony surfaces were found and no bony cuts were found. Each of the pedicles was then tapped with a 4.25 mm tap initially, and then re-tapped with a 5.25 mm tap, each screw hole was again examined with the ball probe good threading was found and no bony cuts were found. We then placed 5.75 by 45 millimeter screws bilaterally at the L4 level, 5.75 by 45 millimeter screws bilaterally at the L5 level, and 5.75 by 30 millimeter screws bilaterally at the S1 level.  We then packed the AVS peek interbody implants with Vitoss BA with bone marrow aspirate and infuse, and then placed the first implant at the L5-S1 level on the right side, carefully retracting the thecal sac and nerve root medially. We then went back to the left side and packed the midline with additional Vitoss BA with bone marrow aspirate and infuse, and then placed a second implant on the left side again retracting the thecal sac and nerve root medially. Then at the L4-5 level, we placed the first implant on the right side, carefully retracting the thecal sac and nerve root medially. We then went back to the left side and packed the midline with additional Vitoss BA with bone marrow aspirate and infuse, and then placed a second implant on the left side, again retracting the thecal sac and nerve root medially.   We then packed the lateral gutter over the transverse processes and intertransverse space with locally harvested morcellized autograft, Vitoss BA with bone marrow aspirate, and infuse. We then selected a 50 mm pre-lordosed rods, they were placed within the screw heads and secured with locking caps once all 6 locking caps were placed final tightening was performed against a counter torque.  The wound  had been irrigated multiple times during the procedure with saline solution and bacitracin solution, good hemostasis was established with a combination of bipolar cautery and Gelfoam with thrombin. Once good hemostasis was confirmed we proceeded with closure paraspinal muscles deep fascia and Scarpa's fascia were closed with interrupted undyed 1 Vicryl sutures the subcutaneous and subcuticular closed with interrupted inverted 2-0 undyed Vicryl sutures the skin edges were approximated with Dermabond.  The wound was dressed with sterile gauze and Hypafix.  Following surgery the patient was turned back to the supine position to be reversed and the anesthetic extubated and transferred to the recovery room for further care.   PLAN OF CARE: Admit to inpatient   PATIENT DISPOSITION:  PACU - hemodynamically stable.   Delay start of Pharmacological VTE agent (>24hrs) due to surgical blood loss or risk of bleeding:  yes

## 2015-07-13 NOTE — Transfer of Care (Signed)
Immediate Anesthesia Transfer of Care Note  Patient: Jose CrazeKenneth Kohrs  Procedure(s) Performed: Procedure(s) with comments: Lumbar four laminectomy Lumbar five Gill procedure with Lumbar four- five, Lumbar five- Sacral one posterior lumbar interbody fusion with interbody prosthesis Lumbar four-Sacral one posterior lateral arthrodesis and posterior segmental instrumentation (N/A) - L4 laminectomy L5 Gill procedure with L45 L5S1 posterior lumbar interbody fusion with interbody prosthesis L4-S1 posterior lateral arthrodesis and posterior segmental instrumentation  Patient Location: PACU  Anesthesia Type:General  Level of Consciousness: awake, oriented and patient cooperative  Airway & Oxygen Therapy: Patient Spontanous Breathing and Patient connected to face mask oxygen  Post-op Assessment: Report given to RN, Post -op Vital signs reviewed and stable and Patient moving all extremities X 4  Post vital signs: Reviewed and stable  Last Vitals:  Filed Vitals:   07/13/15 0729  BP:   Pulse:   Temp: 36.8 C  Resp:     Complications: No apparent anesthesia complications

## 2015-07-13 NOTE — Anesthesia Preprocedure Evaluation (Addendum)
Anesthesia Evaluation  Patient identified by MRN, date of birth, ID band Patient awake    Reviewed: Allergy & Precautions, NPO status   Airway Mallampati: II  TM Distance: >3 FB Neck ROM: Full    Dental   Pulmonary neg pulmonary ROS,    breath sounds clear to auscultation       Cardiovascular hypertension, + CAD   Rhythm:Regular Rate:Normal     Neuro/Psych    GI/Hepatic Neg liver ROS, GERD  ,  Endo/Other  diabetes  Renal/GU negative Renal ROS     Musculoskeletal   Abdominal   Peds  Hematology   Anesthesia Other Findings   Reproductive/Obstetrics                            Anesthesia Physical Anesthesia Plan  ASA: III  Anesthesia Plan: General   Post-op Pain Management:    Induction: Intravenous  Airway Management Planned: Oral ETT  Additional Equipment:   Intra-op Plan:   Post-operative Plan: Possible Post-op intubation/ventilation  Informed Consent: I have reviewed the patients History and Physical, chart, labs and discussed the procedure including the risks, benefits and alternatives for the proposed anesthesia with the patient or authorized representative who has indicated his/her understanding and acceptance.   Dental advisory given  Plan Discussed with: CRNA and Anesthesiologist  Anesthesia Plan Comments:        Anesthesia Quick Evaluation

## 2015-07-13 NOTE — H&P (Signed)
Subjective: Patient is a 59 y.o. 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 Patient had difficulties for a year and a half with pain in the low back, rating of the left buttock, posterior thigh, and calf. Its associated with burning to the left lower extremity, as well as some tingling, but he denies any numbness or weakness. Work, which requires him to be standing, has become increasingly difficult. Patient was studied with x-rays and MRI scan which revealed a bilateral L5 pars interarticularis defect with a grade 1 spondylolisthesis of L5 and S1. There is significant degenerative disc disease at both the L4-5 and L5-S1 levels with loss of disc space height and central disc protrusion at each level. However the most significant finding is of bilateral L5-S1 neural foraminal stenosis, contributed to by the spondylolisthesis, disc bulging, and pseudoarthrosis associated with the pars defect. The stenosis is severe on the left and marked on the right, corresponding to his left lumbar radicular pain.  Patient is admitted now for lumbar decompression and stabilization at the L4-5 and L5-S1 levels.   Past Medical History  Diagnosis Date  . Coronary artery disease   . Hypertension   . Neuromuscular disorder (HCC)   . Arthritis   . Joint pain   . Urinary hesitancy   . Diabetes mellitus without complication (HCC)   . GERD (gastroesophageal reflux disease)     occasionally  take otc meds if needed    Past Surgical History  Procedure Laterality Date  . Coronary angioplasty      2011  stent  2013 angioplasty    Prescriptions prior to admission  Medication Sig Dispense Refill Last Dose  . amLODipine (NORVASC) 10 MG tablet Take 10 mg by mouth daily.   07/13/2015 at 0500  . aspirin EC 81 MG tablet Take 81 mg by mouth daily.   Past Month at Unknown time  .  atorvastatin (LIPITOR) 40 MG tablet Take 40 mg by mouth daily.   07/13/2015 at 0500  . benazepril (LOTENSIN) 40 MG tablet Take 40 mg by mouth daily.   07/12/2015 at Unknown time  . clopidogrel (PLAVIX) 75 MG tablet Take 75 mg by mouth daily.   Past Month at Unknown time  . dapagliflozin propanediol (FARXIGA) 10 MG TABS tablet Take 10 mg by mouth daily.   07/12/2015 at Unknown time  . gemfibrozil (LOPID) 600 MG tablet Take 600 mg by mouth 2 (two) times daily before a meal.   07/13/2015 at 0500  . glipiZIDE (GLUCOTROL) 10 MG tablet Take 10 mg by mouth 2 (two) times daily.   07/13/2015 at 0500  . insulin aspart (NOVOLOG) 100 UNIT/ML injection Inject 20-40 Units into the skin 3 (three) times daily before meals. Patient takes 20-25 units most mornings with novolin n.  Patient takes 30-40 units in the evening depending on meals eaten   07/12/2015 at Unknown time  . insulin NPH Human (HUMULIN N,NOVOLIN N) 100 UNIT/ML injection Inject 20-25 Units into the skin daily before breakfast.   Past Week at Unknown time  . metFORMIN (GLUCOPHAGE) 500 MG tablet Take 500 mg by mouth 2 (two) times daily as needed (if blood sugar is too high.).   07/12/2015 at Unknown time  . metoprolol tartrate (LOPRESSOR) 25 MG tablet Take 25 mg by mouth 2 (two) times daily.   07/13/2015 at 0500  . Omega-3 Fatty Acids (FISH  OIL PO) Take 1 capsule by mouth daily.   Past Month at Unknown time  . tamsulosin (FLOMAX) 0.4 MG CAPS capsule Take 0.4 mg by mouth daily after breakfast.   07/13/2015 at 0500   Allergies  Allergen Reactions  . Morphine And Related Nausea And Vomiting    Social History  Substance Use Topics  . Smoking status: Never Smoker   . Smokeless tobacco: Not on file  . Alcohol Use: No    History reviewed. No pertinent family history.   Review of Systems A comprehensive review of systems was negative.  Objective: Vital signs in last 24 hours: Temp:  [98.2 F (36.8 C)] 98.2 F (36.8 C) (11/09 0729) Pulse Rate:  [62] 62  (11/09 0716) Resp:  [18] 18 (11/09 0716) BP: (150)/(76) 150/76 mmHg (11/09 0716) SpO2:  [97 %] 97 % (11/09 0716)  EXAM: Patient is a well-developed well-nourished white male in no acute distress. Lungs are clear to auscultation , the patient has symmetrical respiratory excursion. Heart has a regular rate and rhythm normal S1 and S2 no murmur.   Abdomen is soft nontender nondistended bowel sounds are present. Extremity examination shows no clubbing cyanosis or edema. Motor examination shows 5 over 5 strength in the lower extremities including the iliopsoas quadriceps dorsiflexor extensor hallicus  longus and plantar flexor bilaterally. Sensation is intact to pinprick in the distal lower extremities. Reflexes are symmetrical bilaterally. No pathologic reflexes are present. Patient has a normal gait and stance.   Data Review:CBC    Component Value Date/Time   WBC 7.0 07/01/2015 1123   RBC 5.22 07/01/2015 1123   HGB 16.1 07/01/2015 1123   HCT 46.8 07/01/2015 1123   PLT 255 07/01/2015 1123   MCV 89.7 07/01/2015 1123   MCH 30.8 07/01/2015 1123   MCHC 34.4 07/01/2015 1123   RDW 12.8 07/01/2015 1123                          BMET    Component Value Date/Time   NA 136 07/01/2015 1123   K 4.5 07/01/2015 1123   CL 103 07/01/2015 1123   CO2 24 07/01/2015 1123   GLUCOSE 157* 07/01/2015 1123   BUN 13 07/01/2015 1123   CREATININE 0.68 07/01/2015 1123   CALCIUM 9.6 07/01/2015 1123   GFRNONAA >60 07/01/2015 1123   GFRAA >60 07/01/2015 1123     Assessment/Plan: Patient with degenerative and congenital changes at the L4-5 and L5-S1 levels as described above, who is admitted now for lumbar decompression and stabilization. Specifically a L4 laminectomy, and L5 Gill procedure, bilateral L4-5 and L5-S1 posterior lumbar interbody arthrodesis with interbody implants and bone graft, and a bilateral L4-S1 posterior lateral arthrodesis with posterior instrumentation and bone graft.  I've discussed with  the patient the nature of his condition, the nature the surgical procedure, the typical length of surgery, hospital stay, and overall recuperation, the limitations postoperatively, and risks of surgery. I discussed risks including risks of infection, bleeding, possibly need for transfusion, the risk of nerve root dysfunction with pain, weakness, numbness, or paresthesias, the risk of dural tear and CSF leakage and possible need for further surgery, the risk of failure of the arthrodesis and possibly for further surgery, the risk of anesthetic complications including myocardial infarction, stroke, pneumonia, and death. We discussed the need for postoperative immobilization in a lumbar brace. Understanding all this the patient does wish to proceed with surgery and is admitted for such.  Hewitt Shorts, MD 07/13/2015 8:07 AM

## 2015-07-13 NOTE — Anesthesia Procedure Notes (Signed)
Procedure Name: Intubation Date/Time: 07/13/2015 8:34 AM Performed by: Bishop LimboARTER, Terez Freimark S Pre-anesthesia Checklist: Timeout performed, Emergency Drugs available, Patient identified, Suction available and Patient being monitored Patient Re-evaluated:Patient Re-evaluated prior to inductionOxygen Delivery Method: Circle system utilized Preoxygenation: Pre-oxygenation with 100% oxygen Intubation Type: IV induction Ventilation: Mask ventilation without difficulty Laryngoscope Size: Miller and 2 Grade View: Grade II Tube type: Oral Tube size: 7.5 mm Number of attempts: 1 Airway Equipment and Method: Stylet Placement Confirmation: ETT inserted through vocal cords under direct vision,  breath sounds checked- equal and bilateral,  positive ETCO2 and CO2 detector Secured at: 22 cm Tube secured with: Tape Dental Injury: Teeth and Oropharynx as per pre-operative assessment

## 2015-07-14 LAB — GLUCOSE, CAPILLARY
Glucose-Capillary: 116 mg/dL — ABNORMAL HIGH (ref 65–99)
Glucose-Capillary: 116 mg/dL — ABNORMAL HIGH (ref 65–99)
Glucose-Capillary: 105 mg/dL — ABNORMAL HIGH (ref 65–99)
Glucose-Capillary: 192 mg/dL — ABNORMAL HIGH (ref 65–99)

## 2015-07-14 MED ORDER — PNEUMOCOCCAL VAC POLYVALENT 25 MCG/0.5ML IJ INJ
0.5000 mL | INJECTION | INTRAMUSCULAR | Status: AC
  Administered 2015-07-15: 0.5 mL via INTRAMUSCULAR
  Filled 2015-07-14: qty 0.5

## 2015-07-14 MED ORDER — INFLUENZA VAC SPLIT QUAD 0.5 ML IM SUSY
0.5000 mL | PREFILLED_SYRINGE | INTRAMUSCULAR | Status: AC
  Administered 2015-07-15: 0.5 mL via INTRAMUSCULAR
  Filled 2015-07-14: qty 0.5

## 2015-07-14 MED FILL — Sodium Chloride IV Soln 0.9%: INTRAVENOUS | Qty: 3000 | Status: AC

## 2015-07-14 MED FILL — Heparin Sodium (Porcine) Inj 1000 Unit/ML: INTRAMUSCULAR | Qty: 30 | Status: AC

## 2015-07-14 NOTE — Anesthesia Postprocedure Evaluation (Signed)
  Anesthesia Post-op Note  Patient: Jose Dalton  Procedure(s) Performed: Procedure(s) with comments: Lumbar four laminectomy Lumbar five Gill procedure with Lumbar four- five, Lumbar five- Sacral one posterior lumbar interbody fusion with interbody prosthesis Lumbar four-Sacral one posterior lateral arthrodesis and posterior segmental instrumentation (N/A) - L4 laminectomy L5 Gill procedure with L45 L5S1 posterior lumbar interbody fusion with interbody prosthesis L4-S1 posterior lateral arthrodesis and posterior segmental instrumentation  Patient Location: PACU  Anesthesia Type:General  Level of Consciousness: awake  Airway and Oxygen Therapy: Patient Spontanous Breathing  Post-op Pain: mild  Post-op Assessment: Post-op Vital signs reviewed   LLE Sensation: Full sensation   RLE Sensation: Full sensation      Post-op Vital Signs: Reviewed  Last Vitals:  Filed Vitals:   07/14/15 1514  BP: 121/64  Pulse: 87  Temp: 37.1 C  Resp: 18    Complications: No apparent anesthesia complications

## 2015-07-14 NOTE — Progress Notes (Signed)
Utilization review completed. Alcide Memoli, RN, BSN. 

## 2015-07-14 NOTE — Progress Notes (Signed)
Filed Vitals:   07/13/15 2000 07/13/15 2341 07/14/15 0400 07/14/15 0751  BP: 123/59 115/58 119/62 123/69  Pulse: 81 69 68 76  Temp: 98.4 F (36.9 C) 98.3 F (36.8 C) 98.2 F (36.8 C) 99.2 F (37.3 C)  TempSrc: Oral Oral Oral   Resp: 20 20 18 18   SpO2: 94% 95% 95% 93%    Patient has been up and ambulating in the halls. Comfortable, good relief of left lumbar radicular pain. Dressing clean and dry. Voiding since Foley DC'd earlier this morning, nursing staff to check PVR with bladder scan after next void. Low-grade fever, encouraged to use incentive spirometry.  Plan: Continue to progress through postoperative recovery. Voiding function to be assessed with PVR. Encouraged to ambulate and use incentive spirometry.  Hewitt ShortsNUDELMAN,ROBERT W, MD 07/14/2015, 11:19 AM

## 2015-07-15 LAB — GLUCOSE, CAPILLARY: Glucose-Capillary: 158 mg/dL — ABNORMAL HIGH (ref 65–99)

## 2015-07-15 MED ORDER — GEMFIBROZIL 600 MG PO TABS: 600.0000 mg | ORAL_TABLET | Freq: Two times a day (BID) | ORAL | Status: AC

## 2015-07-15 MED ORDER — METOPROLOL TARTRATE 25 MG PO TABS: 25.0000 mg | ORAL_TABLET | Freq: Two times a day (BID) | ORAL | Status: AC

## 2015-07-15 MED ORDER — OXYCODONE-ACETAMINOPHEN 5-325 MG PO TABS: 1.0000 | ORAL_TABLET | ORAL | Status: DC | PRN

## 2015-07-15 MED ORDER — CYCLOBENZAPRINE HCL 10 MG PO TABS: 5.0000 mg | ORAL_TABLET | Freq: Three times a day (TID) | ORAL | Status: AC | PRN

## 2015-07-15 MED ORDER — BENAZEPRIL HCL 40 MG PO TABS: 40.0000 mg | ORAL_TABLET | Freq: Every day | ORAL | Status: AC

## 2015-07-15 MED ORDER — AMLODIPINE BESYLATE 10 MG PO TABS: 10.0000 mg | ORAL_TABLET | Freq: Every day | ORAL | Status: AC

## 2015-07-15 MED ORDER — ATORVASTATIN CALCIUM 40 MG PO TABS: 40.0000 mg | ORAL_TABLET | Freq: Every day | ORAL | Status: AC

## 2015-07-15 NOTE — Discharge Summary (Signed)
Physician Discharge Summary  Patient ID: Jose CrazeKenneth Golladay MRN: [id number] DOB/AGE: 59/16/1957 59 y.o.  Admit date: 07/13/2015 Discharge date: 07/15/2015  Admission Diagnoses:  lumbar stenosis with neurogenic claudication, lumbar spondylolisthesis, lumbar disc herniation, lumbar spondylosis, lumbar degenerative disease, left lumbar radiculopathy  Discharge Diagnoses:  lumbar stenosis with neurogenic claudication, lumbar spondylolisthesis, lumbar disc herniation, lumbar spondylosis, lumbar degenerative disease, left lumbar radiculopathy Active Problems:   Lumbar stenosis   Discharged Condition: good  Hospital Course: Patient admitted underwent lumbar decompression and stabilization at the L4-5 and L5-S1 levels. He is done well following surgery with excellent relief of his left lumbar take her pain. He's reasonably comfortable, and is up and ambulating actively in the halls. His wound is clean and dry; there is no ecchymosis, erythema, swelling, or drainage. He is being discharged home with instructions regarding wound care and activities. He has a follow-up appointment scheduled with me in about 3 weeks.  Discharge Exam: Blood pressure 127/68, pulse 74, temperature 98.6 F (37 C), temperature source Oral, resp. rate 18, SpO2 95 %.  Disposition:  home    Medication List    TAKE these medications        amLODipine 10 MG tablet  Commonly known as:  NORVASC  Take 1 tablet (10 mg total) by mouth daily.     aspirin EC 81 MG tablet  Take 81 mg by mouth daily.     atorvastatin 40 MG tablet  Commonly known as:  LIPITOR  Take 1 tablet (40 mg total) by mouth daily.     benazepril 40 MG tablet  Commonly known as:  LOTENSIN  Take 1 tablet (40 mg total) by mouth daily.     clopidogrel 75 MG tablet  Commonly known as:  PLAVIX  Take 75 mg by mouth daily.     cyclobenzaprine 10 MG tablet  Commonly known as:  FLEXERIL  Take 0.5-1 tablets (5-10 mg total) by mouth 3 (three) times daily as  needed for muscle spasms.     FARXIGA 10 MG Tabs tablet  Generic drug:  dapagliflozin propanediol  Take 10 mg by mouth daily.     FISH OIL PO  Take 1 capsule by mouth daily.     gemfibrozil 600 MG tablet  Commonly known as:  LOPID  Take 1 tablet (600 mg total) by mouth 2 (two) times daily before a meal.     glipiZIDE 10 MG tablet  Commonly known as:  GLUCOTROL  Take 10 mg by mouth 2 (two) times daily.     insulin aspart 100 UNIT/ML injection  Commonly known as:  novoLOG  Inject 20-40 Units into the skin 3 (three) times daily before meals. Patient takes 20-25 units most mornings with novolin n.  Patient takes 30-40 units in the evening depending on meals eaten     insulin NPH Human 100 UNIT/ML injection  Commonly known as:  HUMULIN N,NOVOLIN N  Inject 20-25 Units into the skin daily before breakfast.     metFORMIN 500 MG tablet  Commonly known as:  GLUCOPHAGE  Take 500 mg by mouth 2 (two) times daily as needed (if blood sugar is too high.).     metoprolol tartrate 25 MG tablet  Commonly known as:  LOPRESSOR  Take 1 tablet (25 mg total) by mouth 2 (two) times daily.     oxyCODONE-acetaminophen 5-325 MG tablet  Commonly known as:  PERCOCET/ROXICET  Take 1-2 tablets by mouth every 4 (four) hours as needed for moderate pain.  tamsulosin 0.4 MG Caps capsule  Commonly known as:  FLOMAX  Take 0.4 mg by mouth daily after breakfast.         Signed: Hewitt Shorts 07/15/2015, 8:21 AM

## 2015-07-15 NOTE — Progress Notes (Signed)
Pt doing well. Pt and wife given D/C instructions with Rx's, verbal understanding was provided. Pt's incision is open to air and has no sign of infection. Pt's IV was removed prior to D/C. Pt has lumbar corsett brace from home use. Pt D/C'd home via wheelchair @1030  per MD order. Pt is stable @ D/C and has no other needs at this time. Rema FendtAshley Markesia Crilly, RN

## 2015-07-15 NOTE — Discharge Instructions (Signed)
Wound Care °Leave incision open to air. °You may shower. °Do not scrub directly on incision.  °Do not put any creams, lotions, or ointments on incision. °Activity °Walk each and every day, increasing distance each day. °No lifting greater than 5 lbs.  Avoid bending, arching, and twisting. °No driving for 2 weeks; may ride as a passenger locally. °If provided with back brace, wear when out of bed.  It is not necessary to wear in bed. °Diet °Resume your normal diet.  °Return to Work °Will be discussed at you follow up appointment. °Call Your Doctor If Any of These Occur °Redness, drainage, or swelling at the wound.  °Temperature greater than 101 degrees. °Severe pain not relieved by pain medication. °Incision starts to come apart. °Follow Up Appt °Call today for appointment in 3 weeks ([phone]) or for problems.  If you have any hardware placed in your spine, you will need an x-ray before your appointment. ° ° °Spinal Fusion °Spinal fusion is a procedure to make 2 or more of the bones in your spinal column (vertebrae) grow together (fuse). This procedure stops movement between the vertebrae and can relieve pain and prevent deformity.  °Spinal fusion is used to treat the following conditions: °· Fractures of the spine. °· Herniated disk (the spongy material [cartilage] between the vertebrae). °· Abnormal curvatures of the spine, such as scoliosis or kyphosis. °· A weak or an unstable spine, caused by infections or tumor. °RISKS AND COMPLICATIONS °Complications associated with spinal fusion are rare, but they can occur. Possible complications include: °· Bleeding. °· Infection near the incision. °· Nerve damage. Signs of nerve damage are back pain, pain in one or both legs, weakness, or numbness. °· Spinal fluid leakage. °· Blood clot in your leg, which can move to your lungs. °· Difficulty controlling urination or bowel movements. °BEFORE THE PROCEDURE °· A medical evaluation will be done. This will include a physical  exam, blood tests, and imaging exams. °· You will talk with an anesthesiologist. This is the person who will be in charge of the anesthesia during the procedure. Spinal fusion usually requires that you are asleep during the procedure (general anesthesia). °· You will need to stop taking certain medicines, particularly those associated with an increased risk of bleeding. Ask your caregiver about changing or stopping your regular medicines. °· If you smoke, you will need to stop at least 2 weeks before the procedure. Smoking can slow down the healing process, especially fusion of the vertebrae, and increase the risk of complications. °· Do not eat or drink anything for at least 8 hours before the procedure. °PROCEDURE  °A cut (incision) is made over the vertebrae that will be fused. The back muscles are separated from the vertebrae. If you are having this procedure to treat a herniated disk, the disc material pressing on the nerve root is removed (decompression). The area where the disk is removed is then filled with extra bone. Bone from another part of your body (autogenous bone) or bone from a bone donor (allograft bone) may be used. The extra bone promotes fusion between the vertebrae. Sometimes, specific medicines are added to the fusion area to promote bone healing. In most cases, screws and rods or metal plates will be used to attach the vertebrae to stabilize them while they fuse.  °AFTER THE PROCEDURE  °· You will stay in a recovery area until the anesthesia has worn off. Your blood pressure and pulse will be checked frequently. °· You will be   given antibiotics to prevent infection. °· You may continue to receive fluids through an intravenous (IV) tube while you are still in the hospital. °· Pain after surgery is normal. You will be given pain medicine. °· You will be taught how to move correctly and how to stand and walk. While in bed, you will be instructed to turn frequently, using a "log rolling"  technique, in which the entire body is moved without twisting the back. °  °This information is not intended to replace advice given to you by your health care provider. Make sure you discuss any questions you have with your health care provider. °  °Document Released: 05/19/2003 Document Revised: 11/12/2011 Document Reviewed: 02/02/2015 °Elsevier Interactive Patient Education ©2016 [employer] ° °

## 2015-08-31 ENCOUNTER — Emergency Department (HOSPITAL_COMMUNITY): Payer: PPO

## 2015-08-31 ENCOUNTER — Encounter (HOSPITAL_COMMUNITY): Payer: Self-pay | Admitting: Emergency Medicine

## 2015-08-31 ENCOUNTER — Emergency Department (HOSPITAL_COMMUNITY)
Admission: EM | Admit: 2015-08-31 | Discharge: 2015-08-31 | Disposition: A | Discharge status: Home / Self Care | Payer: PPO | Attending: Emergency Medicine | Admitting: Emergency Medicine

## 2015-08-31 DIAGNOSIS — Z7982 Long term (current) use of aspirin: Secondary | ICD-10-CM | POA: Insufficient documentation

## 2015-08-31 DIAGNOSIS — R112 Nausea with vomiting, unspecified: Secondary | ICD-10-CM | POA: Insufficient documentation

## 2015-08-31 DIAGNOSIS — Z8719 Personal history of other diseases of the digestive system: Secondary | ICD-10-CM | POA: Insufficient documentation

## 2015-08-31 DIAGNOSIS — M1990 Unspecified osteoarthritis, unspecified site: Secondary | ICD-10-CM | POA: Insufficient documentation

## 2015-08-31 DIAGNOSIS — Z7984 Long term (current) use of oral hypoglycemic drugs: Secondary | ICD-10-CM | POA: Insufficient documentation

## 2015-08-31 DIAGNOSIS — R079 Chest pain, unspecified: Secondary | ICD-10-CM | POA: Insufficient documentation

## 2015-08-31 DIAGNOSIS — E119 Type 2 diabetes mellitus without complications: Secondary | ICD-10-CM | POA: Insufficient documentation

## 2015-08-31 DIAGNOSIS — I2510 Atherosclerotic heart disease of native coronary artery without angina pectoris: Secondary | ICD-10-CM | POA: Insufficient documentation

## 2015-08-31 DIAGNOSIS — Z794 Long term (current) use of insulin: Secondary | ICD-10-CM | POA: Insufficient documentation

## 2015-08-31 DIAGNOSIS — R42 Dizziness and giddiness: Secondary | ICD-10-CM | POA: Insufficient documentation

## 2015-08-31 DIAGNOSIS — I10 Essential (primary) hypertension: Secondary | ICD-10-CM | POA: Insufficient documentation

## 2015-08-31 DIAGNOSIS — Z7902 Long term (current) use of antithrombotics/antiplatelets: Secondary | ICD-10-CM | POA: Insufficient documentation

## 2015-08-31 DIAGNOSIS — Z79899 Other long term (current) drug therapy: Secondary | ICD-10-CM | POA: Insufficient documentation

## 2015-08-31 LAB — I-STAT CHEM 8, ED
BUN: 16 mg/dL (ref 6–20)
Calcium, Ion: 1.15 mmol/L (ref 1.12–1.23)
Chloride: 108 mmol/L (ref 101–111)
Creatinine, Ser: 0.6 mg/dL — ABNORMAL LOW (ref 0.61–1.24)
Glucose, Bld: 237 mg/dL — ABNORMAL HIGH (ref 65–99)
HCT: 52 % (ref 39.0–52.0)
Hemoglobin: 17.7 g/dL — ABNORMAL HIGH (ref 13.0–17.0)
Potassium: 4.5 mmol/L (ref 3.5–5.1)
Sodium: 142 mmol/L (ref 135–145)
TCO2: 22 mmol/L (ref 0–100)

## 2015-08-31 LAB — BASIC METABOLIC PANEL
Anion gap: 16 — ABNORMAL HIGH (ref 5–15)
BUN: 14 mg/dL (ref 6–20)
CO2: 20 mmol/L — ABNORMAL LOW (ref 22–32)
Calcium: 10 mg/dL (ref 8.9–10.3)
Chloride: 107 mmol/L (ref 101–111)
Creatinine, Ser: 0.77 mg/dL (ref 0.61–1.24)
GFR calc Af Amer: >60 mL/min (ref >60)
GFR calc non Af Amer: >60 mL/min (ref >60)
Glucose, Bld: 245 mg/dL — ABNORMAL HIGH (ref 65–99)
Potassium: 4.7 mmol/L (ref 3.5–5.1)
Sodium: 143 mmol/L (ref 135–145)

## 2015-08-31 LAB — I-STAT TROPONIN, ED: Troponin i, poc: 0 ng/mL (ref 0.00–0.08)

## 2015-08-31 LAB — CBC
HCT: 48.3 % (ref 39.0–52.0)
Hemoglobin: 16.2 g/dL (ref 13.0–17.0)
MCH: 30.7 pg (ref 26.0–34.0)
MCHC: 33.5 g/dL (ref 30.0–36.0)
MCV: 91.5 fL (ref 78.0–100.0)
Platelets: 308 10*3/uL (ref 150–400)
RBC: 5.28 MIL/uL (ref 4.22–5.81)
RDW: 13.8 % (ref 11.5–15.5)
WBC: 10.7 10*3/uL — ABNORMAL HIGH (ref 4.0–10.5)

## 2015-08-31 LAB — PROTIME-INR
INR: 1.1 (ref 0.00–1.49)
Prothrombin Time: 14.4 seconds (ref 11.6–15.2)

## 2015-08-31 LAB — APTT: aPTT: 24 seconds (ref 24–37)

## 2015-08-31 LAB — CBG MONITORING, ED: Glucose-Capillary: 207 mg/dL — ABNORMAL HIGH (ref 65–99)

## 2015-08-31 MED ORDER — MECLIZINE HCL 25 MG PO TABS: 25.0000 mg | ORAL_TABLET | Freq: Three times a day (TID) | ORAL | Status: AC | PRN

## 2015-08-31 MED ORDER — DIPHENHYDRAMINE HCL 25 MG PO TABS: 25.0000 mg | ORAL_TABLET | Freq: Four times a day (QID) | ORAL | Status: AC

## 2015-08-31 MED ORDER — ONDANSETRON 4 MG PO TBDP
ORAL_TABLET | ORAL | Status: DC
Start: 2015-08-31 — End: 2015-08-31
  Filled 2015-08-31: qty 1

## 2015-08-31 MED ORDER — DIPHENHYDRAMINE HCL 50 MG/ML IJ SOLN
25.0000 mg | Freq: Once | INTRAMUSCULAR | Status: AC
  Administered 2015-08-31: 25 mg via INTRAVENOUS
  Filled 2015-08-31: qty 1

## 2015-08-31 MED ORDER — ONDANSETRON HCL 4 MG PO TABS: 4.0000 mg | ORAL_TABLET | Freq: Four times a day (QID) | ORAL | Status: AC

## 2015-08-31 MED ORDER — ONDANSETRON 4 MG PO TBDP
4.0000 mg | ORAL_TABLET | Freq: Once | ORAL | Status: AC
  Administered 2015-08-31: 4 mg via ORAL

## 2015-08-31 MED ORDER — MECLIZINE HCL 25 MG PO TABS
25.0000 mg | ORAL_TABLET | Freq: Once | ORAL | Status: AC
  Administered 2015-08-31: 25 mg via ORAL
  Filled 2015-08-31: qty 1

## 2015-08-31 MED ORDER — SODIUM CHLORIDE 0.9 % IV BOLUS (SEPSIS)
1000.0000 mL | Freq: Once | INTRAVENOUS | Status: AC
  Administered 2015-08-31: 1000 mL via INTRAVENOUS

## 2015-08-31 MED ORDER — DIAZEPAM 5 MG PO TABS: 5.0000 mg | ORAL_TABLET | Freq: Every evening | ORAL | Status: AC | PRN

## 2015-08-31 NOTE — ED Provider Notes (Signed)
[facility] PA-C.  A shunt with marked symptom improvement. He is taking by mouth. He has been ambulatory in the room. So with occasional vertigo. Otherwise is neurologically intact without additional symptoms. Plan is home. Vertigo precautions. Meclizine. Medications until 24 hours without symptoms. No driving until 24 hours of symptoms. Daily at bedtime Valium as needed for nighttime symptoms.  Rolland PorterMark Arya Boxley, MD 08/31/15 [id number] [id number]

## 2015-08-31 NOTE — ED Provider Notes (Signed)
12:13 PM CT staff called concerning imaging study ordered while patient still waiting prior to provider evaluation. Apparently typical protocol for tinnitus is CT temporal bone. Not many EM indications for this study specifically. Per triage note, he likely he vertigo. "Dizziness", positional, ear fullness, tinnitus, etc would be consistent with this and, based on symptoms, likely peripheral etiology. CT canceled for now until provider evaluates him with possible imaging at their discretion.   Raeford RazorStephen Kasyn Rolph, MD 08/31/15 [phone]

## 2015-08-31 NOTE — ED Notes (Addendum)
Pt from home for reports of n/v dizziness and "stopped up ear" to right ear. Pt states began yesterday, states position changes make the dizziness worse and states had some chest tightness that started last night, denies any cp at this time. No neuro deficits noted.  Pt also with back brace applied in triage, recent back surgery 07/2015 no complaints with that. Denies any sob.

## 2015-08-31 NOTE — ED Notes (Signed)
Pt tolerating PO fluids at this time.

## 2015-08-31 NOTE — Discharge Instructions (Signed)
Benign Positional Vertigo °Vertigo is the feeling that you or your surroundings are moving when they are not. Benign positional vertigo is the most common form of vertigo. The cause of this condition is not serious (is benign). This condition is triggered by certain movements and positions (is positional). This condition can be dangerous if it occurs while you are doing something that could endanger you or others, such as driving.  °CAUSES °In many cases, the cause of this condition is not known. It may be caused by a disturbance in an area of the inner ear that helps your brain to sense movement and balance. This disturbance can be caused by a viral infection (labyrinthitis), head injury, or repetitive motion. °RISK FACTORS °This condition is more likely to develop in: °· Women. °· People who are 50 years of age or older. °SYMPTOMS °Symptoms of this condition usually happen when you move your head or your eyes in different directions. Symptoms may start suddenly, and they usually last for less than a minute. Symptoms may include: °· Loss of balance and falling. °· Feeling like you are spinning or moving. °· Feeling like your surroundings are spinning or moving. °· Nausea and vomiting. °· Blurred vision. °· Dizziness. °· Involuntary eye movement (nystagmus). °Symptoms can be mild and cause only slight annoyance, or they can be severe and interfere with daily life. Episodes of benign positional vertigo may return (recur) over time, and they may be triggered by certain movements. Symptoms may improve over time. °DIAGNOSIS °This condition is usually diagnosed by medical history and a physical exam of the head, neck, and ears. You may be referred to a health care provider who specializes in ear, nose, and throat (ENT) problems (otolaryngologist) or a provider who specializes in disorders of the nervous system (neurologist). You may have additional testing, including: °· MRI. °· A CT scan. °· Eye movement tests. Your  health care provider may ask you to change positions quickly while he or she watches you for symptoms of benign positional vertigo, such as nystagmus. Eye movement may be tested with an electronystagmogram (ENG), caloric stimulation, the Dix-Hallpike test, or the roll test. °· An electroencephalogram (EEG). This records electrical activity in your brain. °· Hearing tests. °TREATMENT °Usually, your health care provider will treat this by moving your head in specific positions to adjust your inner ear back to normal. Surgery may be needed in severe cases, but this is rare. In some cases, benign positional vertigo may resolve on its own in 2-4 weeks. °HOME CARE INSTRUCTIONS °Safety °· Move slowly. Avoid sudden body or head movements. °· Avoid driving. °· Avoid operating heavy machinery. °· Avoid doing any tasks that would be dangerous to you or others if a vertigo episode would occur. °· If you have trouble walking or keeping your balance, try using a cane for stability. If you feel dizzy or unstable, sit down right away. °· Return to your normal activities as told by your health care provider. Ask your health care provider what activities are safe for you. °General Instructions °· Take over-the-counter and prescription medicines only as told by your health care provider. °· Avoid certain positions or movements as told by your health care provider. °· Drink enough fluid to keep your urine clear or pale yellow. °· Keep all follow-up visits as told by your health care provider. This is important. °SEEK MEDICAL CARE IF: °· You have a fever. °· Your condition gets worse or you develop new symptoms. °· Your family or friends   notice any behavioral changes. °· Your nausea or vomiting gets worse. °· You have numbness or a "pins and needles" sensation. °SEEK IMMEDIATE MEDICAL CARE IF: °· You have difficulty speaking or moving. °· You are always dizzy. °· You faint. °· You develop severe headaches. °· You have weakness in your  legs or arms. °· You have changes in your hearing or vision. °· You develop a stiff neck. °· You develop sensitivity to light. °  °This information is not intended to replace advice given to you by your health care provider. Make sure you discuss any questions you have with your health care provider. °  °Document Released: 05/28/2006 Document Revised: 05/11/2015 Document Reviewed: 12/13/2014 °Elsevier Interactive Patient Education ©2016 Elsevier Inc. ° ° °Please read attached information. If you experience any new or worsening signs or symptoms please return to the emergency room for evaluation. Please follow-up with your primary care provider or specialist as discussed. Please use medication prescribed only as directed and discontinue taking if you have any concerning signs or symptoms.  °

## 2015-08-31 NOTE — ED Provider Notes (Signed)
CSN: [id number]     Arrival date & time 08/31/15  1141 History   First MD Initiated Contact with Patient 08/31/15 1230     Chief Complaint  Patient presents with  . Chest Pain  . Nausea  . Dizziness    HPI   59 year old male presents today with dizziness nausea vomiting. Patient reports that yesterday morning he went to sit up to get out of bed and had an extreme episode of dizziness. He reports the dizziness as the room was spinning, he was able to sit back down on the bed close his eyes and symptoms dissipated. Patient reports he was able to go through his day without any significant difficulty when sitting down at bed last night he had another episode of the dizziness with room spinning, with associated nausea and vomiting. He reports the symptoms are significantly worsened with head movements, improved with closing his eyes and sitting still. Patient denies any focal neurological deficits including loss of smell vision or taste, no weaknesses in any part of his body, and sensation is intact throughout. He denies any headache, ear pain, trauma to the head, recent change in medications, recent infections. No history of stroke, history of stents, hypertension. She reports that for the last several years he's had ringing in his ears bilateral, no associated nausea or vomiting with this.   Past Medical History  Diagnosis Date  . Coronary artery disease   . Hypertension   . Neuromuscular disorder (HCC)   . Arthritis   . Joint pain   . Urinary hesitancy   . Diabetes mellitus without complication (HCC)   . GERD (gastroesophageal reflux disease)     occasionally  take otc meds if needed   Past Surgical History  Procedure Laterality Date  . Coronary angioplasty      2011  stent  2013 angioplasty   No family history on file. Social History  Substance Use Topics  . Smoking status: Never Smoker   . Smokeless tobacco: None  . Alcohol Use: No    Review of Systems  All other systems  reviewed and are negative.   Allergies  Morphine and related  Home Medications   Prior to Admission medications   Medication Sig Start Date End Date Taking? Authorizing Provider  amLODipine (NORVASC) 10 MG tablet Take 1 tablet (10 mg total) by mouth daily. 07/15/15  Yes Shirlean Kellyobert Nudelman, MD  aspirin EC 81 MG tablet Take 81 mg by mouth daily.   Yes Historical Provider, MD  atorvastatin (LIPITOR) 40 MG tablet Take 1 tablet (40 mg total) by mouth daily. 07/15/15  Yes Shirlean Kellyobert Nudelman, MD  benazepril (LOTENSIN) 40 MG tablet Take 1 tablet (40 mg total) by mouth daily. 07/15/15  Yes Shirlean Kellyobert Nudelman, MD  clopidogrel (PLAVIX) 75 MG tablet Take 75 mg by mouth daily.   Yes Historical Provider, MD  cyclobenzaprine (FLEXERIL) 10 MG tablet Take 0.5-1 tablets (5-10 mg total) by mouth 3 (three) times daily as needed for muscle spasms. 07/15/15  Yes Shirlean Kellyobert Nudelman, MD  dapagliflozin propanediol (FARXIGA) 10 MG TABS tablet Take 10 mg by mouth daily.   Yes Historical Provider, MD  gemfibrozil (LOPID) 600 MG tablet Take 1 tablet (600 mg total) by mouth 2 (two) times daily before a meal. 07/15/15  Yes Shirlean Kellyobert Nudelman, MD  glipiZIDE (GLUCOTROL) 10 MG tablet Take 10 mg by mouth 2 (two) times daily.   Yes Historical Provider, MD  insulin aspart (NOVOLOG) 100 UNIT/ML injection Inject 20-40 Units into the skin 3 (  three) times daily before meals. Patient takes 20-25 units most mornings with novolin n.  Patient takes 30-40 units in the evening depending on meals eaten   Yes Historical Provider, MD  insulin NPH Human (HUMULIN N,NOVOLIN N) 100 UNIT/ML injection Inject 20-25 Units into the skin daily before breakfast.   Yes Historical Provider, MD  metFORMIN (GLUCOPHAGE) 500 MG tablet Take 500 mg by mouth 2 (two) times daily as needed (if blood sugar is too high.).   Yes Historical Provider, MD  metoprolol tartrate (LOPRESSOR) 25 MG tablet Take 1 tablet (25 mg total) by mouth 2 (two) times daily. 07/15/15  Yes Shirlean Kelly, MD  Omega-3 Fatty Acids (FISH OIL PO) Take 1 capsule by mouth daily.   Yes Historical Provider, MD  tamsulosin (FLOMAX) 0.4 MG CAPS capsule Take 0.4 mg by mouth daily after breakfast.   Yes Historical Provider, MD  diphenhydrAMINE (BENADRYL) 25 MG tablet Take 1 tablet (25 mg total) by mouth every 6 (six) hours. 08/31/15   Eyvonne Mechanic, PA-C  meclizine (ANTIVERT) 25 MG tablet Take 1 tablet (25 mg total) by mouth 3 (three) times daily as needed for dizziness. 08/31/15   Tinnie Gens Suhana Wilner, PA-C  ondansetron (ZOFRAN) 4 MG tablet Take 1 tablet (4 mg total) by mouth every 6 (six) hours. 08/31/15   Dameon Soltis, PA-C   BP 150/78 mmHg  Pulse 120  Temp(Src) 97.4 F (36.3 C) (Oral)  Resp 21  Ht  (1.651 m)  Wt 81.647 kg  BMI 29.95 kg/m2  SpO2 94%   Physical Exam  Constitutional: He is oriented to person, place, and time. He appears well-developed and well-nourished.  HENT:  Head: Normocephalic and atraumatic.  Right Ear: Hearing and tympanic membrane normal.  Left Ear: Hearing and tympanic membrane normal.  Mouth/Throat: Uvula is midline and oropharynx is clear and moist.  Eyes: Conjunctivae are normal. Pupils are equal, round, and reactive to light. Right eye exhibits no discharge. Left eye exhibits no discharge. No scleral icterus.  Neck: Normal range of motion. No JVD present. No tracheal deviation present.  Pulmonary/Chest: Effort normal. No stridor.  Neurological: He is alert and oriented to person, place, and time. He has normal strength. No cranial nerve deficit or sensory deficit. Coordination normal. GCS eye subscore is 4. GCS verbal subscore is 5. GCS motor subscore is 6.  Reflex Scores:      Patellar reflexes are 2+ on the right side and 2+ on the left side. Psychiatric: He has a normal mood and affect. His behavior is normal. Judgment and thought content normal.  Nursing note and vitals reviewed.   ED Course  Procedures (including critical care time) Labs  Review Labs Reviewed  BASIC METABOLIC PANEL - Abnormal; Notable for the following:    CO2 20 (*)    Glucose, Bld 245 (*)    Anion gap 16 (*)    All other components within normal limits  CBC - Abnormal; Notable for the following:    WBC 10.7 (*)    All other components within normal limits  CBG MONITORING, ED - Abnormal; Notable for the following:    Glucose-Capillary 207 (*)    All other components within normal limits  I-STAT CHEM 8, ED - Abnormal; Notable for the following:    Creatinine, Ser 0.60 (*)    Glucose, Bld 237 (*)    Hemoglobin 17.7 (*)    All other components within normal limits  PROTIME-INR  APTT  I-STAT TROPOININ, ED    Imaging  Review Dg Chest 2 View  08/31/2015  CLINICAL DATA:  Chest pain. EXAM: CHEST  2 VIEW COMPARISON:  None. FINDINGS: The heart size and mediastinal contours are within normal limits. Both lungs are clear. The visualized skeletal structures are unremarkable. IMPRESSION: Normal chest. Electronically Signed   By: Francene Boyers M.D.   On: 08/31/2015 12:22   I have personally reviewed and evaluated these images and lab results as part of my medical decision-making.   EKG Interpretation None      MDM   Final diagnoses:  Vertigo    Labs: CBG, i-STAT Chem-8, i-STAT troponin, BMP, CBC, PT/INR, APTT-   Imaging: EKG-   Consults:  Therapeutics: Antivert, diphenhydramine, normal saline  Discharge Meds: Antivert  Assessment/Plan: 59 year old male presents with vertigo. Today's presentation most likely represents a peripheral cause. This started yesterday morning resolved spontaneously with patient a symptomatic threats today, returned again last night that persisted since. Patient reports symptoms are more pronounced with head movements, relieved with static posture. Patient has no neurological deficits noted indicate a central cause. Patient was given medications here in the ED that improved his symptoms. Patient will receive normal saline,  then a general, and another dose Antivert with reevaluation within likely discharged home. Pt has complaints of chest discomfort he describes as sore, likely related to vomiting. Troponin negative. EKG normal, low suspicion for ACS. Pt care signed out to Rolland Porter MD at the time of discharge pending re-evaluation and ambulation.        Eyvonne Mechanic, PA-C 08/31/15 1633  Raeford Razor, MD 09/01/15 [phone]

## 2016-05-19 IMAGING — CR DG LUMBAR SPINE 2-3V
2 series · 2 of 2 positions shown · non-contrast
Comparison: MRI lumbar spine 03/11/2015.

CLINICAL DATA: Patient for L4-5 and L5-S1 laminectomy and fusion.
Intraoperative localization films.

EXAM:
LUMBAR SPINE - 2-3 VIEW

[lat (1 of 2)]
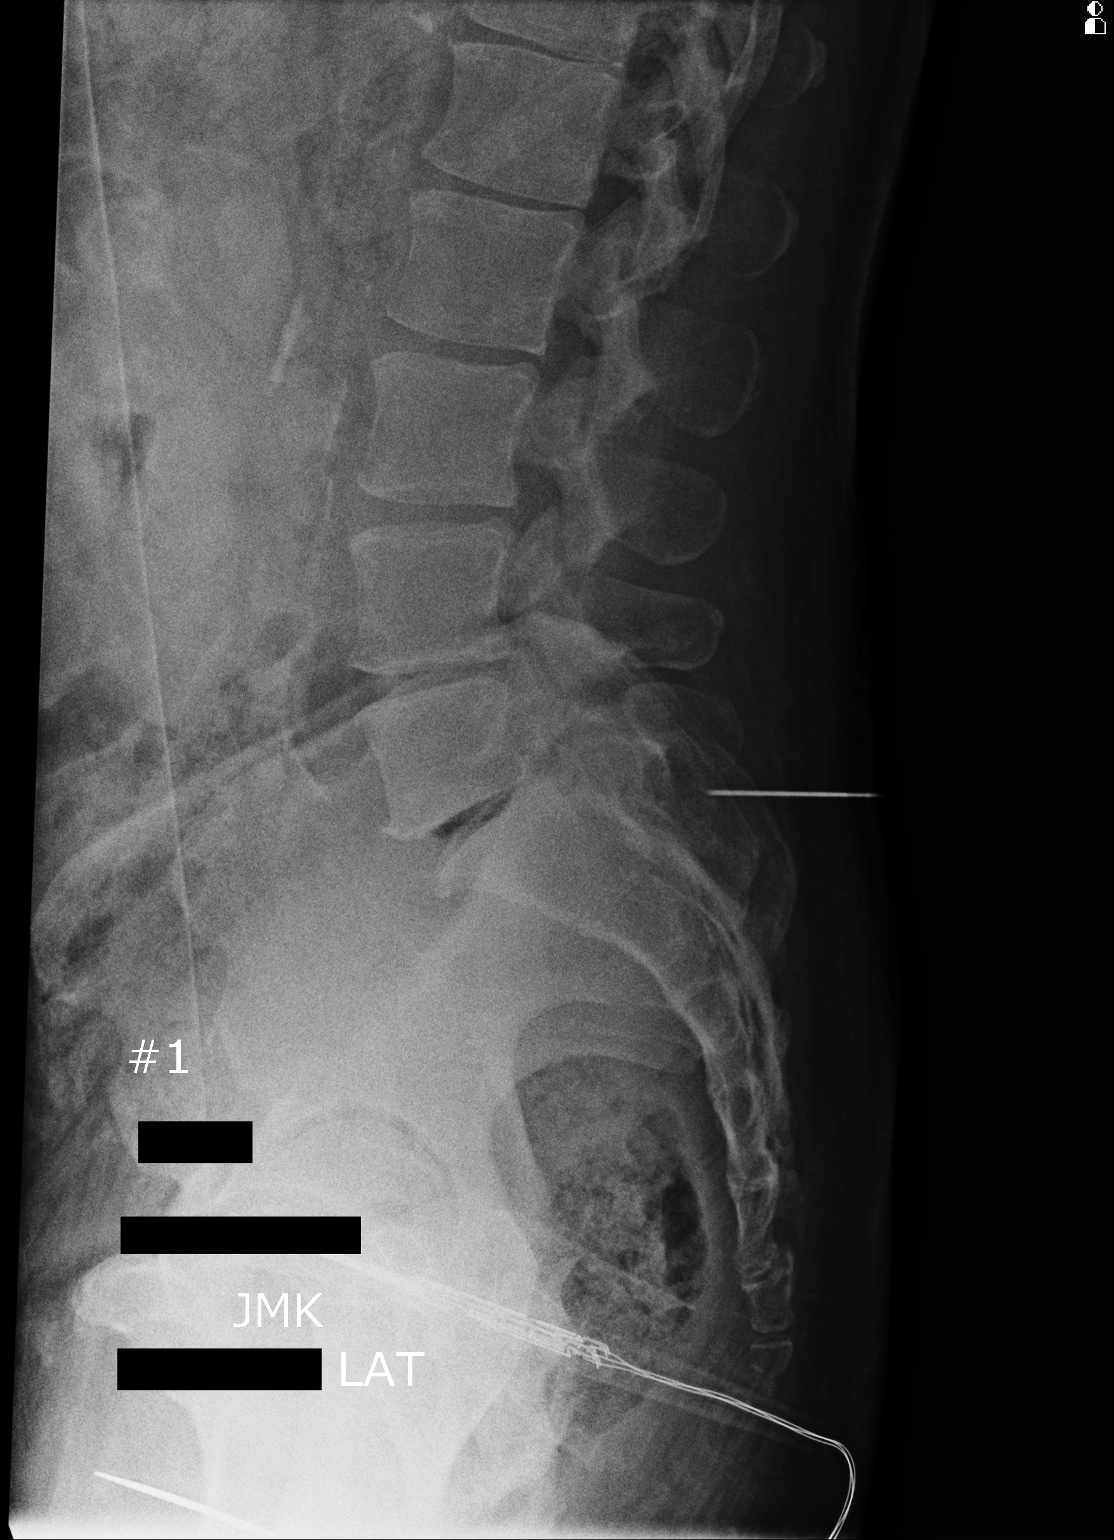

[lat (2 of 2)]
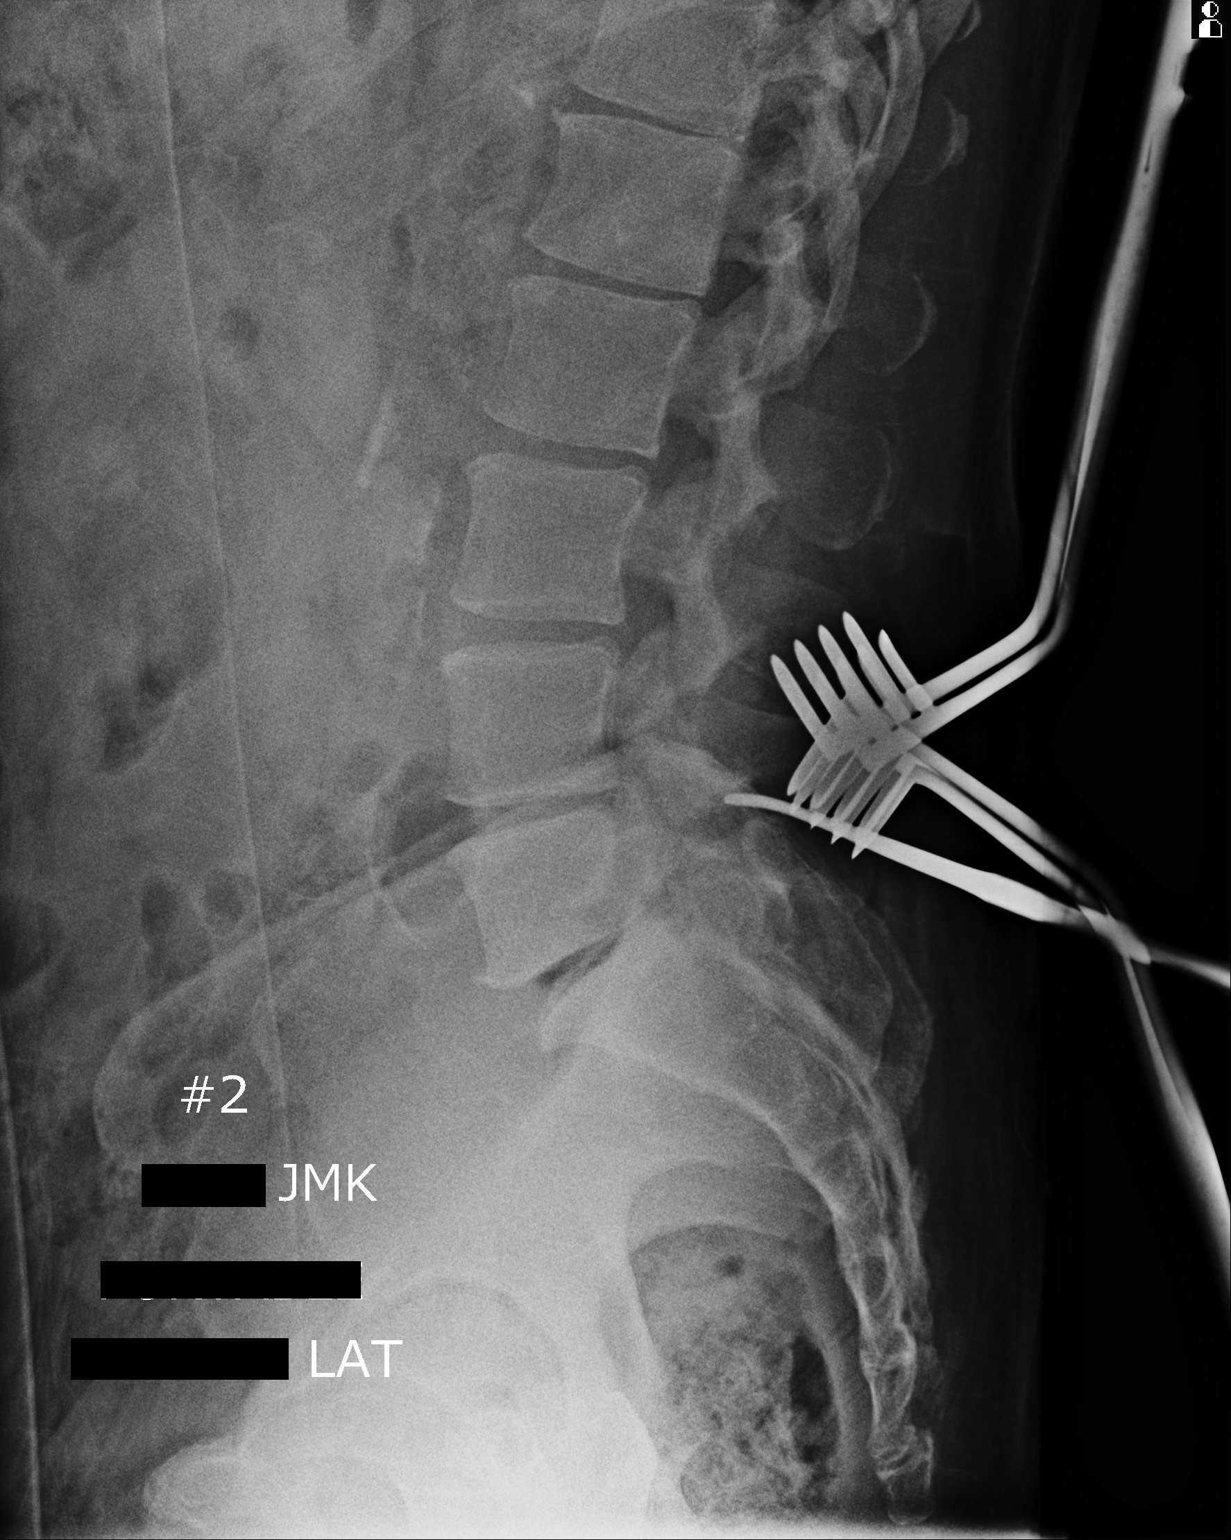

[2 of 2 positions shown; findings below may reference images not displayed]

FINDINGS: 2 intraoperative views of the lumbar spine in the lateral projection
are provided. On the first image, a probe is directed toward the
L5-S1 level. On the second image, a probe is directed toward the
L4-5 level.
IMPRESSION: Localization as above.

## 2016-07-07 IMAGING — CR DG CHEST 2V
2 series · 2 of 2 positions shown · non-contrast
Comparison: None.

CLINICAL DATA: Chest pain.

EXAM:
CHEST  2 VIEW

[chest lat]
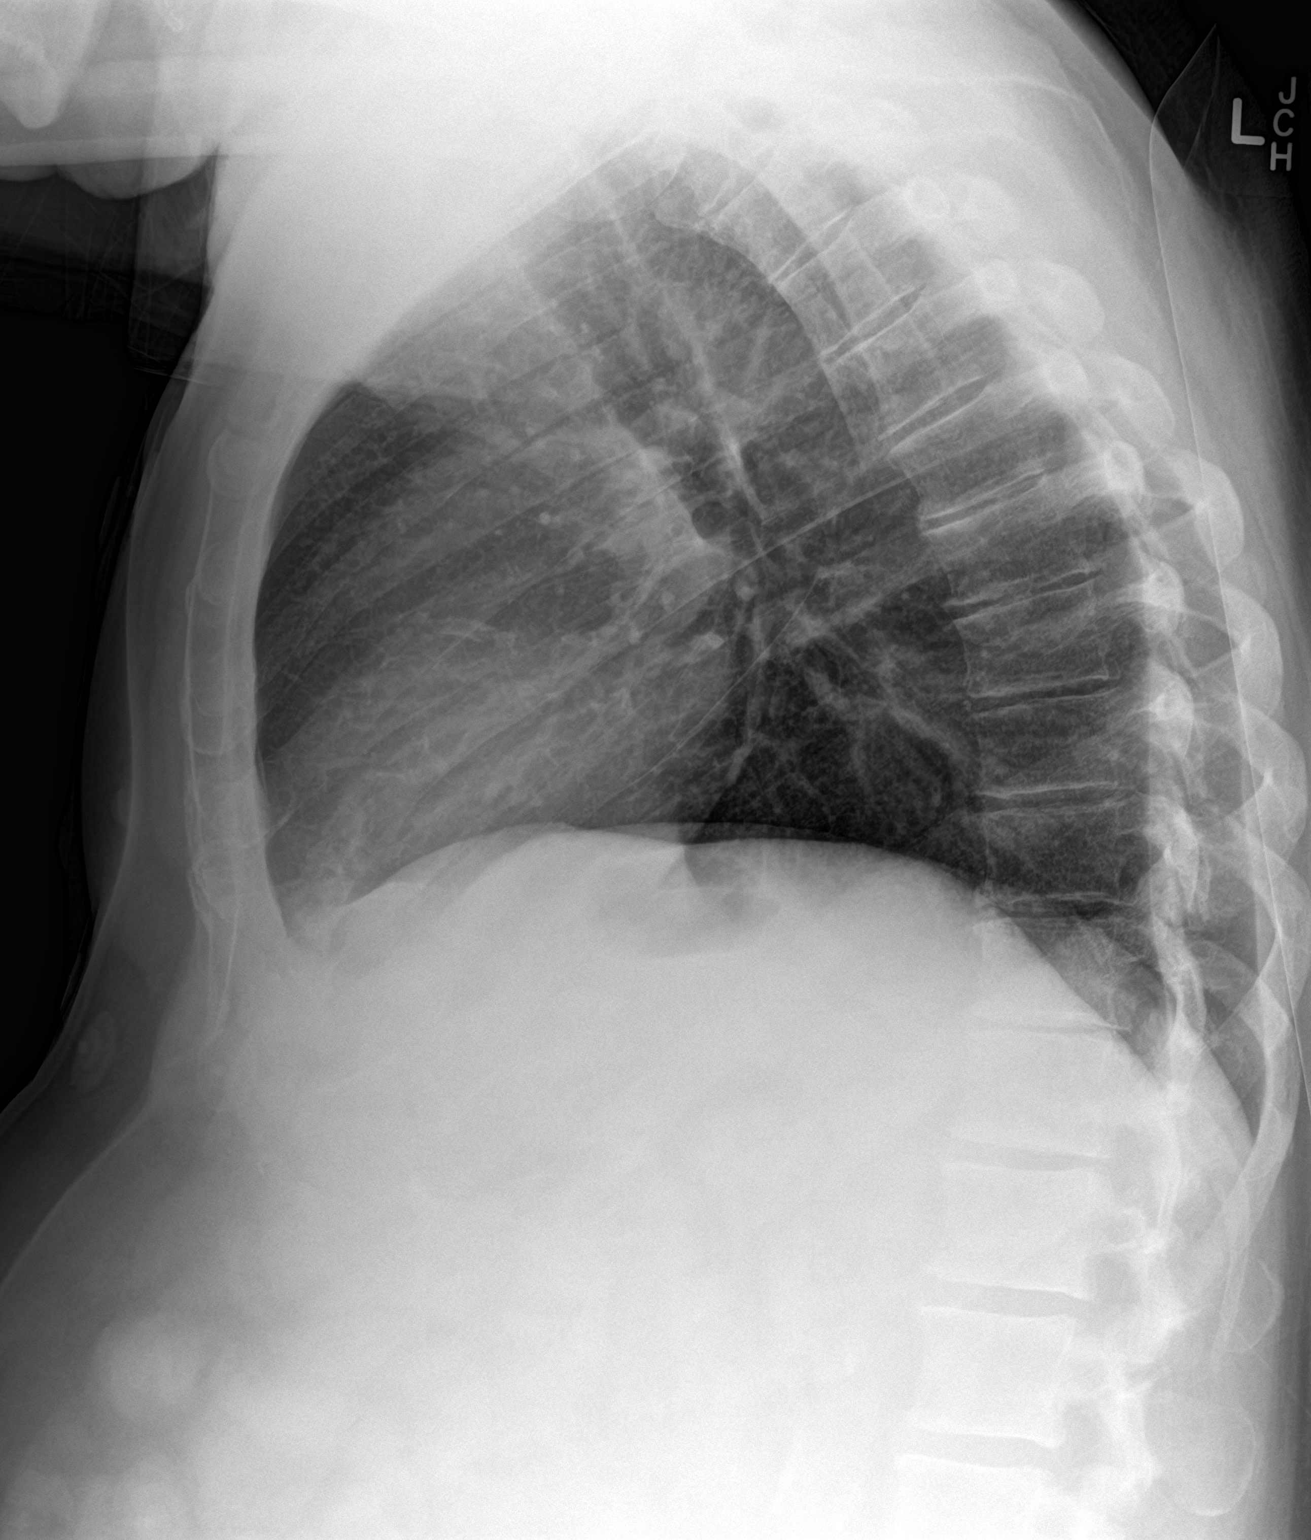

[chest ap]
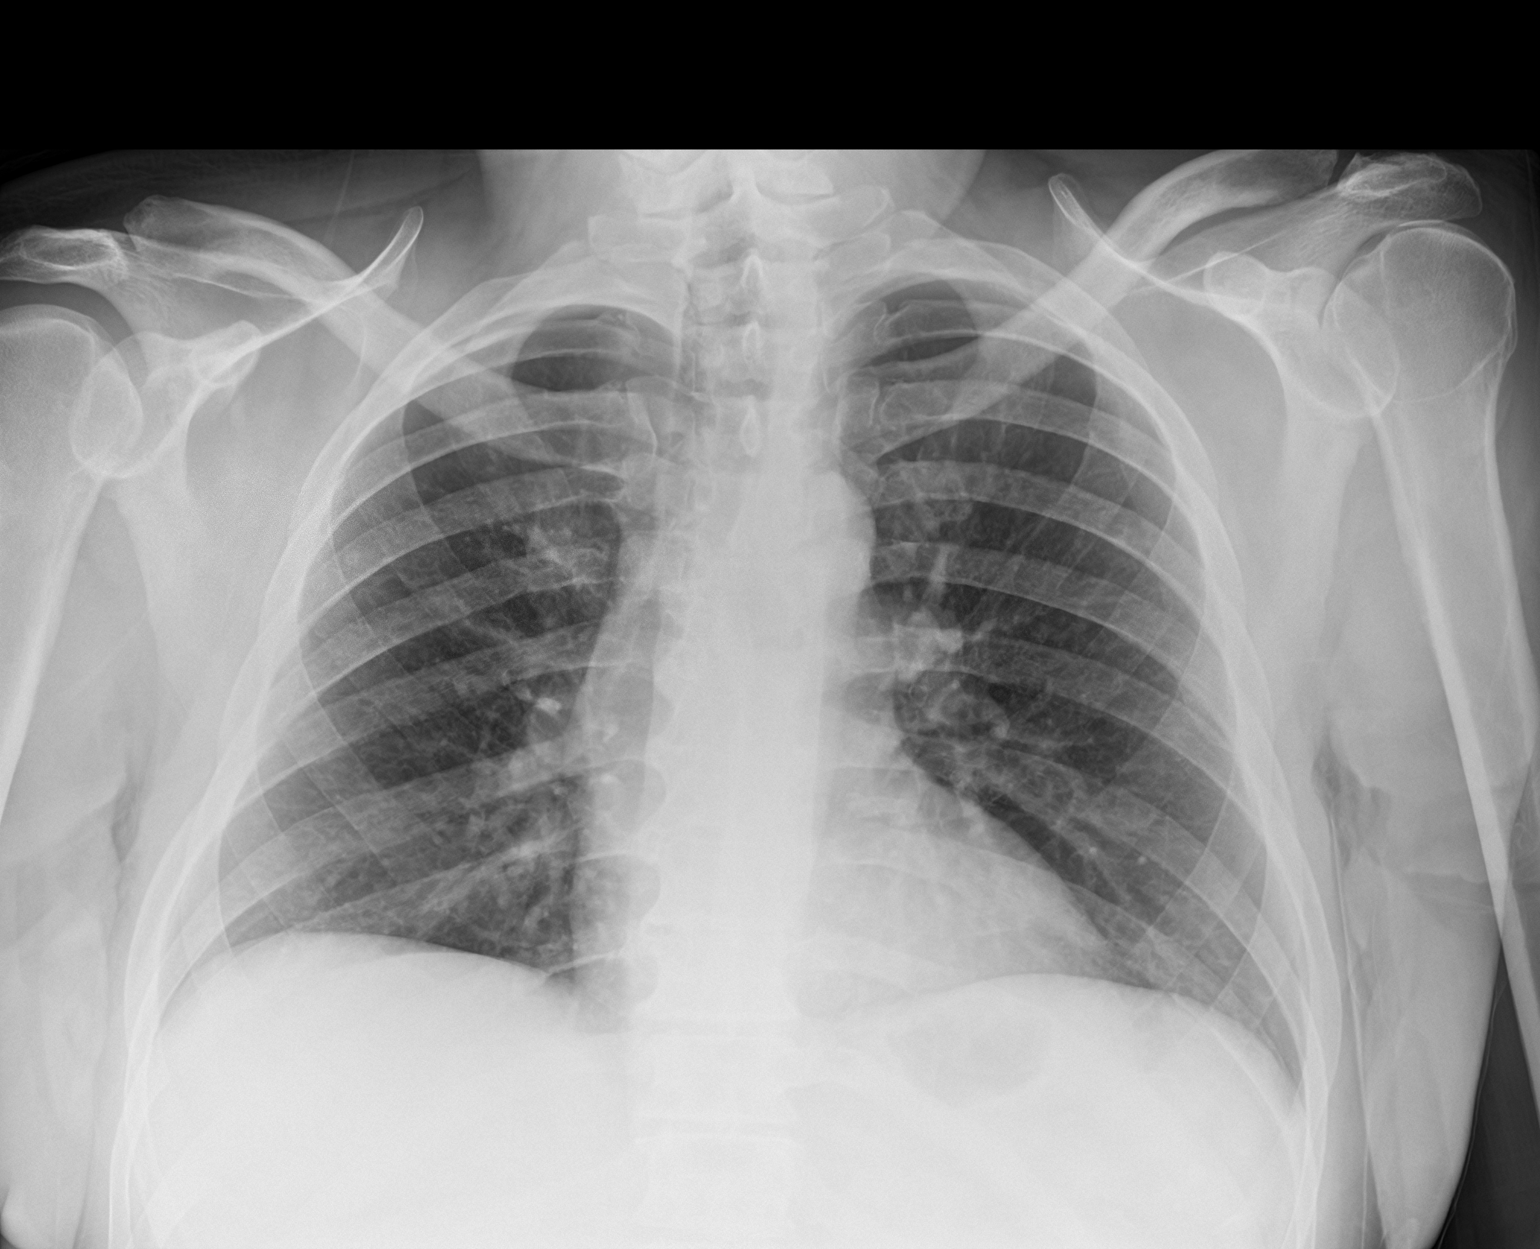

[2 of 2 positions shown; findings below may reference images not displayed]

FINDINGS: The heart size and mediastinal contours are within normal limits.
Both lungs are clear. The visualized skeletal structures are
unremarkable.
IMPRESSION: Normal chest.

## 2021-09-05 ENCOUNTER — Encounter: Payer: Self-pay | Admitting: Nurse Practitioner

## 2024-10-05 ENCOUNTER — Ambulatory Visit: Admitting: Gastroenterology

## 2024-11-17 ENCOUNTER — Ambulatory Visit: Admitting: Gastroenterology
